# Patient Record
Sex: Male | Born: 1950 | Race: White | Hispanic: No | Marital: Married | Smoking: Never smoker
Health system: Southern US, Academic
[De-identification: ages and names within clinical notes are randomized; demographics above are authoritative.]

## PROBLEM LIST (undated history)

## (undated) ENCOUNTER — Inpatient Hospital Stay: Admission: EM | Discharge status: Absent without leave | Payer: Self-pay | Source: Home / Self Care

## (undated) ENCOUNTER — Inpatient Hospital Stay: Admission: EM | Payer: Self-pay | Source: Home / Self Care

## (undated) ENCOUNTER — Inpatient Hospital Stay (HOSPITAL_COMMUNITY): Payer: Medicare Other | Admitting: PULMONARY DISEASE

## (undated) DIAGNOSIS — E039 Hypothyroidism, unspecified: Secondary | ICD-10-CM

## (undated) DIAGNOSIS — R0602 Shortness of breath: Secondary | ICD-10-CM

## (undated) DIAGNOSIS — G4730 Sleep apnea, unspecified: Secondary | ICD-10-CM

## (undated) DIAGNOSIS — R059 Cough, unspecified: Secondary | ICD-10-CM

## (undated) DIAGNOSIS — E119 Type 2 diabetes mellitus without complications: Secondary | ICD-10-CM

## (undated) DIAGNOSIS — K838 Other specified diseases of biliary tract: Secondary | ICD-10-CM

## (undated) DIAGNOSIS — G8929 Other chronic pain: Secondary | ICD-10-CM

## (undated) DIAGNOSIS — Z9889 Other specified postprocedural states: Secondary | ICD-10-CM

## (undated) DIAGNOSIS — I341 Nonrheumatic mitral (valve) prolapse: Secondary | ICD-10-CM

## (undated) DIAGNOSIS — IMO0002 Reserved for concepts with insufficient information to code with codable children: Secondary | ICD-10-CM

## (undated) DIAGNOSIS — K219 Gastro-esophageal reflux disease without esophagitis: Secondary | ICD-10-CM

## (undated) DIAGNOSIS — M549 Dorsalgia, unspecified: Secondary | ICD-10-CM

## (undated) DIAGNOSIS — R3911 Hesitancy of micturition: Secondary | ICD-10-CM

## (undated) HISTORY — PX: HX BACK SURGERY: SHX140

## (undated) HISTORY — PX: HX APPENDECTOMY: SHX54

## (undated) HISTORY — PX: HX GALL BLADDER SURGERY/CHOLE: SHX55

## (undated) HISTORY — DX: Hypothyroidism, unspecified: E03.9

## (undated) HISTORY — DX: Shortness of breath: R06.02

## (undated) HISTORY — DX: Sleep apnea, unspecified: G47.30

## (undated) HISTORY — DX: Other specified diseases of biliary tract: K83.8

## (undated) HISTORY — PX: CARDIAC CATHETERIZATION: SHX172

## (undated) HISTORY — PX: HX HEART VALVE SURGERY: SHX40

## (undated) HISTORY — PX: HX COLONOSCOPY: 2100001147

## (undated) HISTORY — DX: Type 2 diabetes mellitus without complications: E11.9

## (undated) HISTORY — DX: Cough, unspecified: R05.9

## (undated) HISTORY — DX: Gastro-esophageal reflux disease without esophagitis: K21.9

## (undated) HISTORY — PX: CHOLECYSTECTOMY: SHX55

## (undated) HISTORY — DX: Other chronic pain: G89.29

## (undated) HISTORY — PX: HERNIA REPAIR: SHX51

## (undated) HISTORY — PX: APPENDECTOMY: SHX54

## (undated) HISTORY — PX: OTHER SURGICAL HISTORY: SHX169

## (undated) HISTORY — DX: Hesitancy of micturition: R39.11

## (undated) HISTORY — DX: Nonrheumatic mitral (valve) prolapse: I34.1

## (undated) HISTORY — DX: Reserved for concepts with insufficient information to code with codable children: IMO0002

## (undated) HISTORY — DX: Other specified postprocedural states: Z98.890

## (undated) HISTORY — DX: Dorsalgia, unspecified: M54.9

---

## 2003-05-16 ENCOUNTER — Encounter: Payer: Self-pay | Admitting: Emergency Medicine

## 2003-05-16 ENCOUNTER — Emergency Department (HOSPITAL_COMMUNITY): Admission: EM | Admit: 2003-05-16 | Discharge: 2003-05-16 | Payer: Self-pay | Admitting: Emergency Medicine

## 2004-01-20 ENCOUNTER — Emergency Department (HOSPITAL_COMMUNITY): Admission: EM | Admit: 2004-01-20 | Discharge: 2004-01-20 | Payer: Self-pay | Admitting: Emergency Medicine

## 2004-10-18 ENCOUNTER — Ambulatory Visit: Payer: Self-pay | Admitting: Orthopedic Surgery

## 2005-06-03 ENCOUNTER — Emergency Department (HOSPITAL_COMMUNITY): Admission: EM | Admit: 2005-06-03 | Discharge: 2005-06-03 | Payer: Self-pay | Admitting: Emergency Medicine

## 2007-07-16 ENCOUNTER — Other Ambulatory Visit: Payer: Self-pay

## 2007-07-16 ENCOUNTER — Observation Stay: Payer: Self-pay | Admitting: Internal Medicine

## 2008-03-22 ENCOUNTER — Emergency Department (HOSPITAL_COMMUNITY): Admission: EM | Admit: 2008-03-22 | Discharge: 2008-03-22 | Payer: Self-pay | Admitting: Emergency Medicine

## 2008-11-06 ENCOUNTER — Emergency Department (HOSPITAL_COMMUNITY): Admission: EM | Admit: 2008-11-06 | Discharge: 2008-11-06 | Payer: Self-pay | Admitting: Emergency Medicine

## 2009-01-18 ENCOUNTER — Emergency Department (HOSPITAL_COMMUNITY): Admission: EM | Admit: 2009-01-18 | Discharge: 2009-01-18 | Payer: Self-pay | Admitting: Emergency Medicine

## 2009-01-26 DIAGNOSIS — Z9889 Other specified postprocedural states: Secondary | ICD-10-CM

## 2009-01-26 HISTORY — DX: Other specified postprocedural states: Z98.890

## 2009-02-03 ENCOUNTER — Ambulatory Visit: Payer: Self-pay | Admitting: Internal Medicine

## 2009-02-03 DIAGNOSIS — R109 Unspecified abdominal pain: Secondary | ICD-10-CM | POA: Insufficient documentation

## 2009-02-03 DIAGNOSIS — Z8679 Personal history of other diseases of the circulatory system: Secondary | ICD-10-CM | POA: Insufficient documentation

## 2009-02-03 DIAGNOSIS — R112 Nausea with vomiting, unspecified: Secondary | ICD-10-CM

## 2009-02-03 DIAGNOSIS — R3911 Hesitancy of micturition: Secondary | ICD-10-CM

## 2009-02-03 DIAGNOSIS — M549 Dorsalgia, unspecified: Secondary | ICD-10-CM | POA: Insufficient documentation

## 2009-02-03 DIAGNOSIS — IMO0002 Reserved for concepts with insufficient information to code with codable children: Secondary | ICD-10-CM | POA: Insufficient documentation

## 2009-02-03 DIAGNOSIS — K219 Gastro-esophageal reflux disease without esophagitis: Secondary | ICD-10-CM | POA: Insufficient documentation

## 2009-02-20 ENCOUNTER — Ambulatory Visit: Payer: Self-pay | Admitting: Internal Medicine

## 2009-02-20 ENCOUNTER — Encounter: Payer: Self-pay | Admitting: Internal Medicine

## 2009-02-20 ENCOUNTER — Ambulatory Visit (HOSPITAL_COMMUNITY): Admission: RE | Admit: 2009-02-20 | Discharge: 2009-02-20 | Payer: Self-pay | Admitting: Internal Medicine

## 2009-02-20 HISTORY — PX: COLONOSCOPY: SHX174

## 2009-02-20 HISTORY — PX: ESOPHAGOGASTRODUODENOSCOPY: SHX1529

## 2009-02-21 ENCOUNTER — Ambulatory Visit (HOSPITAL_COMMUNITY): Admission: RE | Admit: 2009-02-21 | Discharge: 2009-02-21 | Payer: Self-pay | Admitting: Internal Medicine

## 2009-02-21 ENCOUNTER — Encounter: Payer: Self-pay | Admitting: Internal Medicine

## 2009-02-23 ENCOUNTER — Encounter: Payer: Self-pay | Admitting: Internal Medicine

## 2009-02-28 ENCOUNTER — Ambulatory Visit (HOSPITAL_COMMUNITY): Admission: RE | Admit: 2009-02-28 | Discharge: 2009-02-28 | Payer: Self-pay | Admitting: Internal Medicine

## 2009-04-05 ENCOUNTER — Ambulatory Visit: Payer: Self-pay | Admitting: Internal Medicine

## 2009-04-05 ENCOUNTER — Encounter: Payer: Self-pay | Admitting: Gastroenterology

## 2009-04-05 DIAGNOSIS — K921 Melena: Secondary | ICD-10-CM

## 2009-04-05 DIAGNOSIS — R109 Unspecified abdominal pain: Secondary | ICD-10-CM | POA: Insufficient documentation

## 2009-04-05 DIAGNOSIS — R197 Diarrhea, unspecified: Secondary | ICD-10-CM | POA: Insufficient documentation

## 2009-04-13 LAB — CONVERTED CEMR LAB
Albumin: 4.5 g/dL (ref 3.5–5.2)
Amylase: 23 units/L (ref 0–105)
Basophils Absolute: 0 10*3/uL (ref 0.0–0.1)
Basophils Relative: 0 % (ref 0–1)
Bilirubin, Direct: 0.1 mg/dL (ref 0.0–0.3)
Hemoglobin: 15.5 g/dL (ref 13.0–17.0)
Lipase: 17 units/L (ref 0–75)
Lymphocytes Relative: 24 % (ref 12–46)
Neutro Abs: 4.9 10*3/uL (ref 1.7–7.7)
Neutrophils Relative %: 67 % (ref 43–77)
Platelets: 201 10*3/uL (ref 150–400)
RDW: 13.8 % (ref 11.5–15.5)
Total Bilirubin: 0.3 mg/dL (ref 0.3–1.2)

## 2009-09-13 ENCOUNTER — Ambulatory Visit: Admission: RE | Admit: 2009-09-13 | Discharge: 2009-09-13 | Payer: Self-pay | Admitting: Neurology

## 2009-09-13 ENCOUNTER — Ambulatory Visit: Payer: Self-pay | Admitting: Vascular Surgery

## 2009-09-13 ENCOUNTER — Encounter (INDEPENDENT_AMBULATORY_CARE_PROVIDER_SITE_OTHER): Payer: Self-pay | Admitting: Neurology

## 2009-12-07 ENCOUNTER — Ambulatory Visit (HOSPITAL_COMMUNITY)
Admission: RE | Admit: 2009-12-07 | Discharge: 2009-12-08 | Payer: Self-pay | Source: Home / Self Care | Admitting: Neurosurgery

## 2010-01-07 ENCOUNTER — Emergency Department (HOSPITAL_COMMUNITY): Admission: EM | Admit: 2010-01-07 | Discharge: 2010-01-07 | Payer: Self-pay | Admitting: Emergency Medicine

## 2010-01-08 ENCOUNTER — Observation Stay (HOSPITAL_COMMUNITY): Admission: EM | Admit: 2010-01-08 | Discharge: 2010-01-10 | Payer: Self-pay | Admitting: Emergency Medicine

## 2010-02-02 ENCOUNTER — Emergency Department (HOSPITAL_COMMUNITY): Admission: EM | Admit: 2010-02-02 | Discharge: 2010-02-02 | Payer: Self-pay | Admitting: Emergency Medicine

## 2010-03-14 ENCOUNTER — Encounter: Admission: RE | Admit: 2010-03-14 | Discharge: 2010-03-14 | Payer: Self-pay | Admitting: Neurosurgery

## 2010-03-28 ENCOUNTER — Encounter: Admission: RE | Admit: 2010-03-28 | Discharge: 2010-03-28 | Payer: Self-pay | Admitting: Neurosurgery

## 2010-03-30 ENCOUNTER — Emergency Department (HOSPITAL_COMMUNITY)
Admission: EM | Admit: 2010-03-30 | Discharge: 2010-03-30 | Payer: Self-pay | Source: Home / Self Care | Admitting: Emergency Medicine

## 2010-04-12 ENCOUNTER — Ambulatory Visit (HOSPITAL_COMMUNITY): Admission: RE | Admit: 2010-04-12 | Discharge: 2010-04-13 | Payer: Self-pay | Admitting: Neurosurgery

## 2010-05-31 ENCOUNTER — Ambulatory Visit (HOSPITAL_COMMUNITY)
Admission: RE | Admit: 2010-05-31 | Discharge: 2010-05-31 | Payer: Self-pay | Source: Home / Self Care | Admitting: Neurosurgery

## 2010-10-05 ENCOUNTER — Emergency Department (HOSPITAL_COMMUNITY)
Admission: EM | Admit: 2010-10-05 | Discharge: 2010-10-05 | Disposition: A | Discharge status: Other Acute Inpatient Hospital | Payer: Self-pay | Source: Home / Self Care | Admitting: Emergency Medicine

## 2010-10-05 ENCOUNTER — Inpatient Hospital Stay (HOSPITAL_COMMUNITY)
Admission: EM | Admit: 2010-10-05 | Discharge: 2010-10-09 | Payer: Self-pay | Attending: Internal Medicine | Admitting: Internal Medicine

## 2010-10-16 ENCOUNTER — Ambulatory Visit: Payer: Self-pay | Admitting: Cardiology

## 2010-10-16 DIAGNOSIS — R079 Chest pain, unspecified: Secondary | ICD-10-CM

## 2010-10-18 ENCOUNTER — Encounter: Payer: Self-pay | Admitting: Adult Health

## 2010-10-23 ENCOUNTER — Ambulatory Visit: Payer: Self-pay | Admitting: Internal Medicine

## 2010-11-29 NOTE — Assessment & Plan Note (Signed)
Summary: POST CATH / CHECK CATH SITE/TG   Visit Type:  Follow-up Referring Provider:  Dr. Al Decant Primary Provider:  Dr.Mead   History of Present Illness: Gary Marsh is a pleasant 60 y/o CM we are seeing post hospitalization for complaints of chest pain, with strong family history of CAD and sudden cardiac death.  He has history of hypertension, dyslipidemia, and arthritis with multiple back surgeries.  He was seen at [facility] and transferred to [facility] where he underwent cardiac catherization.  He was found to have normal coronaries.  He has a follow-up appointment with Dr. Kendell Bane next week for continued GI evaluation for discomfort.  Current Medications (verified): 1)  Atenolol 25 Mg Tabs (Atenolol) .... Once Daily 2)  Lovaza 1 Gm Caps (Omega-3-Acid Ethyl Esters) .... Two Times A Day 3)  Ranitidine Hcl 150 Mg Caps (Ranitidine Hcl) .... Once Daily 4)  Nexium 40 Mg Pack (Esomeprazole Magnesium) .... Once Daily 5)  Lyrica 150 Mg Caps (Pregabalin) .... Two Times A Day 6)  Fiorinal/codeine #3 50-325-40-30 Mg Caps (Butalbital-Asa-Caff-Codeine) .... Qid 7)  Promethazine Hcl 25 Mg Tabs (Promethazine Hcl) .... One By Mouth Every 4-6 Hours As Needed N/v 8)  Hyoscyamine Sulfate 0.125 Mg Subl (Hyoscyamine Sulfate) .... One Sl Qac and At Bedtime or Qid As Needed For Abd Pain, May Cause Urinary Retention  Allergies (verified): No Known Drug Allergies  Comments:  Nurse/Medical Assistant: patient brought med list stated meds are correct patients pharmacy is [facility]  Past History:  Past medical, surgical, family and social histories (including risk factors) reviewed, and no changes noted (except as noted below).  Past Medical History: Reviewed history from 02/03/2009 and no changes required. 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 Left inguinal herniorrhaphy 5 back surgeries Appendectomy (1610'R)  Family History: Reviewed history from 02/03/2009 and no changes required. Father:(70 deceased) MI  Mother: (54 deceased) metastatic colon carcinoma  Siblings: (4 deceased) 1 brother & sister-same kind of cancer behind heart?, 2 deceased with MI 3 living sisters healthy      Social History: Reviewed history from 02/03/2009 and no changes required. Marital Status: Married 1972 Children: 1 daughter healthy Occupation: disabled secondary chronic back pain, 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  Alcohol Use - no Daily Caffeine Use Illicit Drug Use - no Patient gets regular exercise.  Review of Systems       All other systems have been reviewed and are negative unless stated above.   Vital Signs:  Patient profile:   60 year old male Weight:      182 pounds BMI:     26.21 O2 Sat:      97 % on Room air Pulse rate:   93 / minute BP sitting:   124 / 73  (right arm)  Vitals Entered By: Dreama Saa, CNA (October 16, 2010 2:04 PM)  O2 Flow:  Room air  Physical Exam  General:  Well developed, well nourished, in no acute distress. Lungs:  Clear bilaterally to auscultation and percussion. Heart:  Non-displaced PMI, chest non-tender; regular rate and rhythm, S1, S2 without murmurs, rubs or gallops. Carotid upstroke normal, no bruit. Normal abdominal aortic size, no bruits. Femorals normal pulses, no bruits. Pedals normal pulses. No edema, no varicosities. Pulses:  pulses normal in all 4 extremities Psych:  Normal affect.   Impression &  Recommendations:  Problem # 1:  CHEST PAIN UNSPECIFIED (ICD-786.50) As stated, his cardiac catherization is found to be normal .  He is given reassurance and will be seen on a as needed basis.  He is to continue to see Dr. Renard Matter and Dr. Kendell Bane for further assessment and treatment of discomfort.   His updated medication list for this  problem includes:    Atenolol 25 Mg Tabs (Atenolol) ..... Once daily  Patient Instructions: 1)  Your physician recommends that you schedule a follow-up appointment in: as needed  2)  Your physician recommends that you continue on your current medications as directed. Please refer to the Current Medication list given to you today.

## 2010-11-29 NOTE — Assessment & Plan Note (Signed)
Summary: HOSP F/U GU/LAW   Visit Type:  Initial Consult Referring Mialynn Shelvin:  Verne Carrow, MD Primary Care Magda Muise:  Dr. Bethena Midget  CC:  hosp follow up- was having chest pain and cardiac ruled out.  History of Present Illness: Gary Marsh presents today in hospital f/u. He was admitted to [facility] Dec 9-13 secondary to chest pain which was felt to be GI related. He was thoroughly worked up from a cardiac standpoint and all was negative. Reports symptoms since Thanksgiving left upper chest pain/discomfort, intermittent, r/t eating/drinking, states worse if eats too much. described as "sharp". reported as 6/10 when occurs. no belching/gas. Denies nausea. States wife was sick over Thanksgiving and has been stressed out regarding this. States symptoms worsened with stress. Was on Ranitidine 300 mg in the past. Had come off approximately 1 year ago, yet was still taking Nexium once daily.  symptoms occurred near Thanksgiving. was 168 in June 2010, now 184 today. Has been taking Nexium twice daily since last Wednesday. Has noticed improvement of symptoms with this. Last EGD/TCS done in April 2010 with normal esophagus, small antral ulceration s/p biopsy, otherwise normal.     April 2010 by RMR:. Esophagogastroduodenoscopy:  Normal esophagus.  A small antral       ulceration status post biopsy otherwise unremarkable stomach,       normal duodenum one and two.   2. Colonoscopy findings:  Rectal polyp status post hot snare removal,       otherwise unremarkable rectum, scattered left-sided diverticula,       diminutive ascending colon polyp status post cold biopsy removal.       The remainder of the colonic mucosa appeared normal.      Current Medications (verified): 1)  Atenolol 25 Mg Tabs (Atenolol) .... Once Daily 2)  Lovaza 1 Gm Caps (Omega-3-Acid Ethyl Esters) .... Two Times A Day 3)  Ranitidine Hcl 150 Mg Caps (Ranitidine Hcl) .... Once Daily 4)  Nexium 40 Mg Pack (Esomeprazole Magnesium) ....  Once Daily 5)  Lyrica 150 Mg Caps (Pregabalin) .... Two Times A Day 6)  Fiorinal/codeine #3 50-325-40-30 Mg Caps (Butalbital-Asa-Caff-Codeine) .... Qid 7)  Hyoscyamine Sulfate 0.125 Mg Subl (Hyoscyamine Sulfate) .... One Sl Qac and At Bedtime or Qid As Needed For Abd Pain, May Cause Urinary Retention  Allergies (verified): 1)  ! Tegretol (Carbamazepine) 2)  ! 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) GERD (ICD-530.81) Chronic Back pain  Past Surgical History: Last updated: Feb 06, 2009 Left inguinal herniorrhaphy 5 back surgeries Appendectomy (1610'R)  Family History: Last updated: 06-Feb-2009 Father:(70 deceased) MI  Mother: 03/11/1985 deceased) metastatic colon carcinoma  Siblings: (4 deceased) 1 brother & sister-same kind of cancer behind heart?, 2 deceased with MI 3 living sisters healthy      Social History: Last updated: 06-Feb-2009 Marital Status: Married Mar 12, 1971 Children: 1 daughter healthy Occupation: disabled secondary chronic back pain, 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  Alcohol Use - no Daily Caffeine Use Illicit Drug Use - no Patient gets regular exercise.  Review of Systems General:  Denies fever, chills, and anorexia. Eyes:  Denies blurring, irritation, and discharge. ENT:  Denies sore throat, hoarseness, and difficulty swallowing. CV:  Complains of chest pains; denies palpitations, syncope, and dyspnea on exertion. Resp:  Denies dyspnea at rest and wheezing. GI:  Denies difficulty swallowing, pain on swallowing, nausea, change in bowel habits, bloody BM's,  and black BMs. GU:  Denies urinary burning and urinary frequency. MS:  Denies joint pain / LOM, joint stiffness, and joint deformity. Derm:  Denies rash, itching, and dry skin. Neuro:  Denies weakness and syncope. Psych:  Denies depression  and anxiety. Endo:  Denies cold intolerance and heat intolerance. Heme:  Denies bruising and bleeding.  Vital Signs:  Patient profile:   60 year old male Height:      70 inches Weight:      184 pounds BMI:     26.50 Temp:     98.2 degrees F oral Pulse rate:   76 / minute BP sitting:   108 / 72  (left arm) Cuff size:   regular  Vitals Entered By: Gary Marsh (October 23, 2010 10:30 AM)  Physical Exam  General:  Well developed, well nourished, no acute distress. Head:  Normocephalic and atraumatic. Eyes:  sclera without icterus, conjuctiva clear Mouth:  No deformity or lesions, dentition normal. Lungs:  Clear throughout to auscultation. Heart:  Regular rate and rhythm; no murmurs, rubs,  or bruits. Abdomen:  normal bowel sounds, obese, without guarding, without rebound, no distesion, no tenderness, and no hepatomegally or splenomegaly.   Msk:  Symmetrical with no gross deformities. Normal posture. Pulses:  Normal pulses noted. Extremities:  No clubbing, cyanosis, edema or deformities noted. Neurologic:  Alert and  oriented x4;  grossly normal neurologically. Psych:  Alert and cooperative. Normal mood and affect.  Impression & Recommendations:  Problem # 1:  CHEST PAIN UNSPECIFIED (ICD-92.36)  60 year old with hx of reflux, last EGD in April 2010 showing small antral erosion. Recently hospitalized for chest pain, deemed non-cardiac in origin after thorough work-up. Has reported exacerbation of left-sided chest discomfort since Thanksgiving, worsened with stress and overeating. Denies nausea, dysphagia, odynophagia. Has gained 26 lbs since last June. Discomfort significantly improved since Nexium increased to two times a day last week. Likely gastritis, possible ulcer-related, also wt gain has likely exacerbated symptoms.  Continue Nexium two times a day for a total of one month, then daily May need EGD. Will discuss with RMR. Weight loss of 1# per week.  Return in 3  mos  Orders: Consultation Level III [phone])  Appended Document: HOSP F/U GU/LAW 3 MON F/U OPV IS IN THE COMPUTER

## 2011-01-07 LAB — CK TOTAL AND CKMB (NOT AT ARMC)
CK, MB: 1.2 ng/mL (ref 0.3–4.0)
Relative Index: INVALID (ref 0.0–2.5)
Total CK: 40 U/L (ref 7–232)
Total CK: 43 U/L (ref 7–232)

## 2011-01-07 LAB — COMPREHENSIVE METABOLIC PANEL
ALT: 19 U/L (ref 0–53)
AST: 18 U/L (ref 0–37)
CO2: 26 mEq/L (ref 19–32)
Calcium: 9 mg/dL (ref 8.4–10.5)
Creatinine, Ser: 0.94 mg/dL (ref 0.4–1.5)
GFR calc Af Amer: >60 mL/min (ref >60)
GFR calc non Af Amer: >60 mL/min (ref >60)
Glucose, Bld: 104 mg/dL — ABNORMAL HIGH (ref 70–99)
Sodium: 140 mEq/L (ref 135–145)
Total Protein: 7.3 g/dL (ref 6.0–8.3)

## 2011-01-07 LAB — D-DIMER, QUANTITATIVE: D-Dimer, Quant: 0.22 ug/mL-FEU (ref 0.00–0.48)

## 2011-01-07 LAB — POCT CARDIAC MARKERS
CKMB, poc: <1 ng/mL — ABNORMAL LOW (ref 1.0–8.0)
Myoglobin, poc: 33.8 ng/mL (ref 12–200)
Troponin i, poc: <0.05 ng/mL (ref 0.00–0.09)

## 2011-01-07 LAB — DIFFERENTIAL
Eosinophils Absolute: 0.4 10*3/uL (ref 0.0–0.7)
Lymphocytes Relative: 36 % (ref 12–46)
Lymphs Abs: 2.7 10*3/uL (ref 0.7–4.0)
Monocytes Relative: 6 % (ref 3–12)
Neutrophils Relative %: 53 % (ref 43–77)

## 2011-01-07 LAB — TROPONIN I
Troponin I: 0.01 ng/mL (ref 0.00–0.06)
Troponin I: <0.01 ng/mL (ref 0.00–0.06)

## 2011-01-07 LAB — CBC
Hemoglobin: 15.5 g/dL (ref 13.0–17.0)
Platelets: 259 10*3/uL (ref 150–400)
RBC: 4.77 MIL/uL (ref 4.22–5.81)

## 2011-01-08 LAB — BASIC METABOLIC PANEL
BUN: 10 mg/dL (ref 6–23)
BUN: 9 mg/dL (ref 6–23)
BUN: 9 mg/dL (ref 6–23)
CO2: 30 mEq/L (ref 19–32)
Chloride: 101 mEq/L (ref 96–112)
Chloride: 103 mEq/L (ref 96–112)
Chloride: 103 mEq/L (ref 96–112)
Glucose, Bld: 106 mg/dL — ABNORMAL HIGH (ref 70–99)
Glucose, Bld: 118 mg/dL — ABNORMAL HIGH (ref 70–99)
Potassium: 3.4 mEq/L — ABNORMAL LOW (ref 3.5–5.1)
Potassium: 3.9 mEq/L (ref 3.5–5.1)
Potassium: 3.9 mEq/L (ref 3.5–5.1)
Sodium: 136 mEq/L (ref 135–145)
Sodium: 138 mEq/L (ref 135–145)

## 2011-01-08 LAB — CBC
HCT: 38.7 % — ABNORMAL LOW (ref 39.0–52.0)
HCT: 38.7 % — ABNORMAL LOW (ref 39.0–52.0)
HCT: 42.1 % (ref 39.0–52.0)
HCT: 45.1 % (ref 39.0–52.0)
Hemoglobin: 13.5 g/dL (ref 13.0–17.0)
Hemoglobin: 14.6 g/dL (ref 13.0–17.0)
Hemoglobin: 15.8 g/dL (ref 13.0–17.0)
MCH: 31.9 pg (ref 26.0–34.0)
MCHC: 34.7 g/dL (ref 30.0–36.0)
MCV: 91.7 fL (ref 78.0–100.0)
MCV: 92.1 fL (ref 78.0–100.0)
MCV: 92.1 fL (ref 78.0–100.0)
MCV: 92.1 fL (ref 78.0–100.0)
RBC: 4.2 MIL/uL — ABNORMAL LOW (ref 4.22–5.81)
RBC: 4.2 MIL/uL — ABNORMAL LOW (ref 4.22–5.81)
RDW: 12.9 % (ref 11.5–15.5)
RDW: 13 % (ref 11.5–15.5)
RDW: 13.2 % (ref 11.5–15.5)
RDW: 13.4 % (ref 11.5–15.5)
WBC: 11.2 10*3/uL — ABNORMAL HIGH (ref 4.0–10.5)
WBC: 13.4 10*3/uL — ABNORMAL HIGH (ref 4.0–10.5)
WBC: 8.7 10*3/uL (ref 4.0–10.5)

## 2011-01-08 LAB — CARDIAC PANEL(CRET KIN+CKTOT+MB+TROPI)
CK, MB: 0.9 ng/mL (ref 0.3–4.0)
Relative Index: INVALID (ref 0.0–2.5)
Troponin I: 0.01 ng/mL (ref 0.00–0.06)
Troponin I: 0.03 ng/mL (ref 0.00–0.06)

## 2011-01-08 LAB — PROTIME-INR: INR: 1.01 (ref 0.00–1.49)

## 2011-01-08 LAB — LIPID PANEL
LDL Cholesterol: 81 mg/dL (ref 0–99)
VLDL: 30 mg/dL (ref 0–40)

## 2011-01-08 LAB — HEMOGLOBIN A1C: Mean Plasma Glucose: 111 mg/dL (ref <117)

## 2011-01-08 LAB — TSH: TSH: 4.665 u[IU]/mL — ABNORMAL HIGH (ref 0.350–4.500)

## 2011-01-08 LAB — HEPARIN LEVEL (UNFRACTIONATED): Heparin Unfractionated: 0.34 IU/mL (ref 0.30–0.70)

## 2011-01-08 LAB — MRSA PCR SCREENING: MRSA by PCR: NEGATIVE

## 2011-01-13 LAB — COMPREHENSIVE METABOLIC PANEL
BUN: 10 mg/dL (ref 6–23)
CO2: 27 mEq/L (ref 19–32)
Calcium: 9 mg/dL (ref 8.4–10.5)
Creatinine, Ser: 0.85 mg/dL (ref 0.4–1.5)
GFR calc non Af Amer: >60 mL/min (ref >60)
Glucose, Bld: 93 mg/dL (ref 70–99)
Sodium: 136 mEq/L (ref 135–145)
Total Protein: 6.9 g/dL (ref 6.0–8.3)

## 2011-01-13 LAB — CBC
HCT: 44.3 % (ref 39.0–52.0)
Hemoglobin: 15.7 g/dL (ref 13.0–17.0)
MCHC: 35.3 g/dL (ref 30.0–36.0)
MCV: 94.6 fL (ref 78.0–100.0)
RBC: 4.68 MIL/uL (ref 4.22–5.81)
RDW: 13.7 % (ref 11.5–15.5)

## 2011-01-14 LAB — URINALYSIS, ROUTINE W REFLEX MICROSCOPIC
Leukocytes, UA: NEGATIVE
Nitrite: NEGATIVE
Specific Gravity, Urine: 1.015 (ref 1.005–1.030)
Urobilinogen, UA: 0.2 mg/dL (ref 0.0–1.0)

## 2011-01-14 LAB — DIFFERENTIAL
Basophils Relative: 0 % (ref 0–1)
Monocytes Relative: 6 % (ref 3–12)
Neutro Abs: 7.9 10*3/uL — ABNORMAL HIGH (ref 1.7–7.7)
Neutrophils Relative %: 71 % (ref 43–77)

## 2011-01-14 LAB — COMPREHENSIVE METABOLIC PANEL
Alkaline Phosphatase: 78 U/L (ref 39–117)
BUN: 18 mg/dL (ref 6–23)
Calcium: 10.1 mg/dL (ref 8.4–10.5)
Glucose, Bld: 118 mg/dL — ABNORMAL HIGH (ref 70–99)
Potassium: 4.2 mEq/L (ref 3.5–5.1)
Total Protein: 8.6 g/dL — ABNORMAL HIGH (ref 6.0–8.3)

## 2011-01-14 LAB — CBC
HCT: 51 % (ref 39.0–52.0)
Hemoglobin: 17.6 g/dL — ABNORMAL HIGH (ref 13.0–17.0)
MCHC: 34.6 g/dL (ref 30.0–36.0)
RDW: 13.3 % (ref 11.5–15.5)

## 2011-01-14 LAB — URINE MICROSCOPIC-ADD ON

## 2011-01-15 ENCOUNTER — Encounter (INDEPENDENT_AMBULATORY_CARE_PROVIDER_SITE_OTHER): Payer: Self-pay | Admitting: *Deleted

## 2011-01-17 LAB — BASIC METABOLIC PANEL
BUN: 19 mg/dL (ref 6–23)
CO2: 22 mEq/L (ref 19–32)
Calcium: 9.8 mg/dL (ref 8.4–10.5)
Creatinine, Ser: 1.26 mg/dL (ref 0.4–1.5)
Glucose, Bld: 87 mg/dL (ref 70–99)

## 2011-01-17 LAB — DIFFERENTIAL
Basophils Absolute: 0 10*3/uL (ref 0.0–0.1)
Basophils Relative: 0 % (ref 0–1)
Eosinophils Absolute: 0.2 10*3/uL (ref 0.0–0.7)
Neutro Abs: 8.5 10*3/uL — ABNORMAL HIGH (ref 1.7–7.7)
Neutrophils Relative %: 73 % (ref 43–77)

## 2011-01-17 LAB — CBC
MCHC: 35.6 g/dL (ref 30.0–36.0)
MCV: 95.1 fL (ref 78.0–100.0)
RDW: 13.1 % (ref 11.5–15.5)

## 2011-01-20 LAB — DIFFERENTIAL
Basophils Absolute: 0 10*3/uL (ref 0.0–0.1)
Basophils Relative: 0 % (ref 0–1)
Basophils Relative: 0 % (ref 0–1)
Eosinophils Absolute: 0.2 10*3/uL (ref 0.0–0.7)
Eosinophils Relative: 3 % (ref 0–5)
Lymphs Abs: 1.3 10*3/uL (ref 0.7–4.0)
Monocytes Absolute: 0.7 10*3/uL (ref 0.1–1.0)
Monocytes Relative: 6 % (ref 3–12)
Neutro Abs: 4.9 10*3/uL (ref 1.7–7.7)
Neutrophils Relative %: 56 % (ref 43–77)
Neutrophils Relative %: 76 % (ref 43–77)

## 2011-01-20 LAB — URINALYSIS, ROUTINE W REFLEX MICROSCOPIC
Bilirubin Urine: NEGATIVE
Leukocytes, UA: NEGATIVE
Nitrite: NEGATIVE
Specific Gravity, Urine: 1.025 (ref 1.005–1.030)
Urobilinogen, UA: 0.2 mg/dL (ref 0.0–1.0)

## 2011-01-20 LAB — CBC
HCT: 42.9 % (ref 39.0–52.0)
HCT: 48 % (ref 39.0–52.0)
Hemoglobin: 15.1 g/dL (ref 13.0–17.0)
MCHC: 35 g/dL (ref 30.0–36.0)
MCV: 93.7 fL (ref 78.0–100.0)
MCV: 93.9 fL (ref 78.0–100.0)
RBC: 5.11 MIL/uL (ref 4.22–5.81)
WBC: 8.9 10*3/uL (ref 4.0–10.5)
WBC: 8.9 10*3/uL (ref 4.0–10.5)

## 2011-01-20 LAB — COMPREHENSIVE METABOLIC PANEL
Alkaline Phosphatase: 60 U/L (ref 39–117)
BUN: 5 mg/dL — ABNORMAL LOW (ref 6–23)
CO2: 27 mEq/L (ref 19–32)
Chloride: 105 mEq/L (ref 96–112)
Creatinine, Ser: 1.01 mg/dL (ref 0.4–1.5)
GFR calc non Af Amer: >60 mL/min (ref >60)
Glucose, Bld: 91 mg/dL (ref 70–99)
Potassium: 3.4 mEq/L — ABNORMAL LOW (ref 3.5–5.1)
Total Bilirubin: 0.8 mg/dL (ref 0.3–1.2)

## 2011-01-20 LAB — C-REACTIVE PROTEIN: CRP: 0.1 mg/dL — ABNORMAL LOW (ref <0.6)

## 2011-01-20 LAB — HEPATIC FUNCTION PANEL
AST: 18 U/L (ref 0–37)
Albumin: 3.6 g/dL (ref 3.5–5.2)
Total Protein: 6.4 g/dL (ref 6.0–8.3)

## 2011-01-20 LAB — BASIC METABOLIC PANEL
BUN: 9 mg/dL (ref 6–23)
CO2: 24 mEq/L (ref 19–32)
Chloride: 100 mEq/L (ref 96–112)
Creatinine, Ser: 0.94 mg/dL (ref 0.4–1.5)
GFR calc Af Amer: >60 mL/min (ref >60)
Potassium: 4.3 mEq/L (ref 3.5–5.1)

## 2011-01-20 LAB — WOUND CULTURE

## 2011-01-20 LAB — LIPASE, BLOOD: Lipase: 28 U/L (ref 11–59)

## 2011-01-24 NOTE — Letter (Signed)
Summary: Recall Office Visit  [facility]  [address]   [place], [place] [address]   Phone: [phone]  Fax: [phone]      January 15, 2011   WINDELL MUSSON [address] [place], [place]  [address] 11-11-1950   Dear Mr. Herschberger,   According to our records, it is time for you to schedule a follow-up office visit with [place].   At your convenience, please call [phone] to schedule an office visit. If you have any questions, concerns, or feel that this letter is in error, we would appreciate your call.   Sincerely,    Diana Eves  [facility] Ph: [phone]   Fax: [phone]

## 2011-02-06 LAB — COMPREHENSIVE METABOLIC PANEL
AST: 15 U/L (ref 0–37)
CO2: 28 mEq/L (ref 19–32)
Calcium: 8.6 mg/dL (ref 8.4–10.5)
Creatinine, Ser: 1.01 mg/dL (ref 0.4–1.5)
GFR calc Af Amer: >60 mL/min (ref >60)
GFR calc non Af Amer: >60 mL/min (ref >60)

## 2011-02-06 LAB — LIPASE, BLOOD: Lipase: 23 U/L (ref 11–59)

## 2011-02-07 LAB — BASIC METABOLIC PANEL
CO2: 25 mEq/L (ref 19–32)
Chloride: 105 mEq/L (ref 96–112)
Glucose, Bld: 108 mg/dL — ABNORMAL HIGH (ref 70–99)
Potassium: 3.7 mEq/L (ref 3.5–5.1)
Sodium: 138 mEq/L (ref 135–145)

## 2011-02-07 LAB — CBC
Hemoglobin: 15.6 g/dL (ref 13.0–17.0)
RBC: 4.77 MIL/uL (ref 4.22–5.81)

## 2011-02-07 LAB — DIFFERENTIAL
Lymphocytes Relative: 24 % (ref 12–46)
Monocytes Absolute: 0.4 10*3/uL (ref 0.1–1.0)
Monocytes Relative: 6 % (ref 3–12)
Neutro Abs: 4.3 10*3/uL (ref 1.7–7.7)

## 2011-02-11 LAB — BASIC METABOLIC PANEL
BUN: 9 mg/dL (ref 6–23)
Chloride: 103 mEq/L (ref 96–112)
Glucose, Bld: 169 mg/dL — ABNORMAL HIGH (ref 70–99)
Potassium: 3 mEq/L — ABNORMAL LOW (ref 3.5–5.1)

## 2011-02-11 LAB — CBC
HCT: 44.7 % (ref 39.0–52.0)
MCV: 93.6 fL (ref 78.0–100.0)
Platelets: 205 10*3/uL (ref 150–400)
RDW: 13 % (ref 11.5–15.5)
WBC: 9 10*3/uL (ref 4.0–10.5)

## 2011-02-11 LAB — DIFFERENTIAL
Eosinophils Absolute: 0.5 10*3/uL (ref 0.0–0.7)
Eosinophils Relative: 5 % (ref 0–5)
Lymphs Abs: 1.8 10*3/uL (ref 0.7–4.0)

## 2011-02-11 LAB — POCT CARDIAC MARKERS

## 2011-02-28 ENCOUNTER — Encounter: Payer: Self-pay | Admitting: Gastroenterology

## 2011-02-28 ENCOUNTER — Ambulatory Visit (INDEPENDENT_AMBULATORY_CARE_PROVIDER_SITE_OTHER): Payer: Self-pay | Admitting: Gastroenterology

## 2011-02-28 VITALS — BP 123/69 | HR 91 | Temp 98.3°F | Ht 70.0 in | Wt 188.6 lb

## 2011-02-28 DIAGNOSIS — R109 Unspecified abdominal pain: Secondary | ICD-10-CM

## 2011-02-28 DIAGNOSIS — R079 Chest pain, unspecified: Secondary | ICD-10-CM

## 2011-02-28 DIAGNOSIS — K219 Gastro-esophageal reflux disease without esophagitis: Secondary | ICD-10-CM

## 2011-02-28 DIAGNOSIS — R1032 Left lower quadrant pain: Secondary | ICD-10-CM

## 2011-02-28 DIAGNOSIS — K649 Unspecified hemorrhoids: Secondary | ICD-10-CM

## 2011-02-28 LAB — CBC WITH DIFFERENTIAL/PLATELET
Basophils Absolute: 0 10*3/uL (ref 0.0–0.1)
Eosinophils Relative: 4 % (ref 0–5)
HCT: 41.3 % (ref 39.0–52.0)
Hemoglobin: 14.5 g/dL (ref 13.0–17.0)
Lymphocytes Relative: 22 % (ref 12–46)
Lymphs Abs: 2.5 10*3/uL (ref 0.7–4.0)
MCV: 91.6 fL (ref 78.0–100.0)
Monocytes Absolute: 1 10*3/uL (ref 0.1–1.0)
Monocytes Relative: 9 % (ref 3–12)
RDW: 13.8 % (ref 11.5–15.5)
WBC: 11.4 10*3/uL — ABNORMAL HIGH (ref 4.0–10.5)

## 2011-02-28 MED ORDER — HYDROCORTISONE ACETATE 25 MG RE SUPP: 25.0000 mg | Freq: Two times a day (BID) | RECTAL | Status: AC

## 2011-02-28 NOTE — Patient Instructions (Addendum)
Please have labs drawn.  We have set you up for a CT scan as well. We will call you with the results.  Continue the Nexium daily.  Try to follow a low-fat diet. Let's shoot for a 1 # wt loss per week. See handout, which can help you with meal planning as well.   We will see you back in 3 months.

## 2011-02-28 NOTE — Progress Notes (Signed)
Referring Provider: Dr. Clifton James Primary Care Physician:  Dr. Bethena Midget Primary Gastroenterologist: Dr. Jena Gauss  Chief Complaint  Patient presents with  . Follow-up    HPI:   Mr. Gary Marsh presents in f/u for left-sided chest discomfort, reflux, cardiac work-up negative. Most recent EGD in 2010. He reports resolution of this discomfort; had taken Nexium BID for one month then return to 1 per day. Denies reflux exacerbation or dysphagia. Up an additional 4 lbs. Has gained close to 30 lbs since last June. States cooks for his wife, who has COPD, which makes it difficult for him to really watch his portion sizes as he eats her leftovers.  Now complains of LLQ pain, somewhat underlying. Worsened with sitting or driving for a long time. Described as sharp, intermittent. Not affected by eating/drinking. BM twice/day. No melena or brbpr. No fever or chills. At times, rectal discomfort with BM.  Reports hx of shingles, states affected left-sided abdomen  Past Medical History  Diagnosis Date  . MVP (mitral valve prolapse)   . DDD (degenerative disc disease)   . GERD (gastroesophageal reflux disease)   . Chronic back pain   . S/P endoscopy April 2010    small antral erosion  . S/P colonoscopy April 2010    left-sided diverticula, small rectal polyp    Past Surgical History  Procedure Date  . Left inguinal hernia repair   . Back surgery x 5   . Appendectomy     Current Outpatient Prescriptions  Medication Sig Dispense Refill  . atenolol (TENORMIN) 25 MG tablet Take 25 mg by mouth daily.        . butalbital-aspirin-caffeine (FIORINAL) 50-325-40 MG per capsule Take 1 capsule by mouth 2 (two) times daily as needed.        [name] esomeprazole (NEXIUM) 40 MG capsule Take 40 mg by mouth daily before breakfast.        . omega-3 acid ethyl esters (LOVAZA) 1 G capsule Take 2 g by mouth 2 (two) times daily.        . pregabalin (LYRICA) 150 MG capsule Take 150 mg by mouth 2 (two) times daily.        .  ranitidine (ZANTAC) 300 MG capsule Take 300 mg by mouth every evening.                Allergies as of 02/28/2011 - Review Complete 02/28/2011  Allergen Reaction Noted  . Carbamazepine    . Duloxetine      Family History  Problem Relation Age of Onset  . Colon cancer Mother     deceased age 51    History   Social History  . Marital Status: Married    Spouse Name: N/A    Number of Children: N/A  . Years of Education: N/A   Social History Main Topics  . Smoking status: Never Smoker   . Smokeless tobacco: None  . Alcohol Use: No  . Drug Use: No    Review of Systems: Gen: Denies fever, chills, anorexia. Denies fatigue, weakness, weight loss.  CV: Denies chest pain, palpitations, syncope, peripheral edema, and claudication. Resp: Denies dyspnea at rest, cough, wheezing, coughing up blood, and pleurisy. GI: Denies vomiting blood, jaundice, and fecal incontinence.   Denies dysphagia or odynophagia. Derm: Denies rash, itching, dry skin Psych: Denies depression, anxiety, memory loss, confusion. No homicidal or suicidal ideation.  Heme: Denies bruising, bleeding, and enlarged lymph nodes.  Physical Exam: BP 123/69  Pulse 91  Temp(Src) 98.3 F (36.8 C) (  Tympanic)  Ht 5\' 10"  (1.778 m)  Wt 188 lb 9.6 oz (85.548 kg)  BMI 27.06 kg/m2 General:   Alert and oriented. No distress noted. Pleasant and cooperative.  Head:  Normocephalic and atraumatic. Eyes:  Conjuctiva clear without scleral icterus. Mouth:  Oral mucosa pink and moist. Good dentition. No lesions. Neck:  Supple, without mass or thyromegaly. Heart:  S1, S2 present without murmurs, rubs, or gallops. Regular rate and rhythm. Abdomen:  +BS, soft, non-distended. Mild tenderness to palpation LLQ. No rebound or guarding. No HSM or masses noted. Rectal: small hemorrhoidal tag, no thrombosed hemorrhoids. No discharge noted. No other abnormalities.  Msk:  Symmetrical without gross deformities. Normal posture. Extremities:   Without edema. Neurologic:  Alert and  oriented x4;  grossly normal neurologically. Skin:  Intact without significant lesions or rashes. Psych:  Alert and cooperative. Normal mood and affect.

## 2011-03-03 ENCOUNTER — Encounter: Payer: Self-pay | Admitting: Gastroenterology

## 2011-03-03 DIAGNOSIS — R1032 Left lower quadrant pain: Secondary | ICD-10-CM | POA: Insufficient documentation

## 2011-03-03 DIAGNOSIS — K649 Unspecified hemorrhoids: Secondary | ICD-10-CM | POA: Insufficient documentation

## 2011-03-03 NOTE — Assessment & Plan Note (Signed)
Resolved since last visit. Although gained 4 lbs, responded well to Nexium BID and is now back to once/day. Likely GERD related; pt instructed on importance of wt loss, lifestyle changes. Continue Nexium daily. Attempt wt loss, follow low-fat diet. Last EGD April 2010. No dysphagia/odynophagia.

## 2011-03-03 NOTE — Assessment & Plan Note (Signed)
Occasional rectal discomfort with BM although no melena or brbpr. Does have hemorrhoidal tag on rectal exam. Question component of ext/internal hemorrhoids. Will trial anusol suppositories X 10 days. High fiber diet, avoid constipation.

## 2011-03-03 NOTE — Assessment & Plan Note (Addendum)
Several month hx of worsening LLQ abdominal pain, described as underlying but intermittent waves of "sharp" discomfort. Worsened with sitting/driving for long periods of time. No change in bowel habits, no melena or brbpr. BM twice/day. Question functional abdominal pain, possible residual effect from shingles though less likely. Does have known diverticula; will proceed with CT to assess for any etiology with further rec's to follow. CBC today.

## 2011-03-04 ENCOUNTER — Ambulatory Visit (HOSPITAL_COMMUNITY)
Admission: RE | Admit: 2011-03-04 | Discharge: 2011-03-04 | Disposition: A | Discharge status: Home / Self Care | Payer: Medicare Other | Source: Ambulatory Visit | Attending: Gastroenterology | Admitting: Gastroenterology

## 2011-03-04 DIAGNOSIS — R1032 Left lower quadrant pain: Secondary | ICD-10-CM

## 2011-03-04 MED ORDER — IOHEXOL 300 MG/ML  SOLN
100.0000 mL | Freq: Once | INTRAMUSCULAR | Status: AC | PRN
Start: 2011-03-04 — End: 2011-03-04
  Administered 2011-03-04: 100 mL via INTRAVENOUS

## 2011-03-04 NOTE — Progress Notes (Signed)
Cc to PCP 

## 2011-03-10 ENCOUNTER — Emergency Department (HOSPITAL_COMMUNITY)
Admission: EM | Admit: 2011-03-10 | Discharge: 2011-03-10 | Disposition: A | Discharge status: Home / Self Care | Payer: Medicare Other | Attending: Emergency Medicine | Admitting: Emergency Medicine

## 2011-03-10 ENCOUNTER — Emergency Department (HOSPITAL_COMMUNITY): Payer: Medicare Other

## 2011-03-10 DIAGNOSIS — Z79899 Other long term (current) drug therapy: Secondary | ICD-10-CM | POA: Insufficient documentation

## 2011-03-10 DIAGNOSIS — M545 Low back pain, unspecified: Secondary | ICD-10-CM | POA: Insufficient documentation

## 2011-03-10 DIAGNOSIS — W3189XA Contact with other specified machinery, initial encounter: Secondary | ICD-10-CM | POA: Insufficient documentation

## 2011-03-10 DIAGNOSIS — Y92009 Unspecified place in unspecified non-institutional (private) residence as the place of occurrence of the external cause: Secondary | ICD-10-CM | POA: Insufficient documentation

## 2011-03-10 DIAGNOSIS — M79609 Pain in unspecified limb: Secondary | ICD-10-CM | POA: Insufficient documentation

## 2011-03-10 DIAGNOSIS — E7800 Pure hypercholesterolemia, unspecified: Secondary | ICD-10-CM | POA: Insufficient documentation

## 2011-03-12 NOTE — Op Note (Signed)
Gary Marsh, Gary Marsh             ACCOUNT NO.:  [id number]   MEDICAL RECORD NO.:  [id number]          PATIENT TYPE:  AMB   LOCATION:  DAY                           FACILITY:  [facility]   PHYSICIAN:  R. Roetta Sessions, M.D. DATE OF BIRTH:  05/24/1951   DATE OF PROCEDURE:  02/20/2009  DATE OF DISCHARGE:                               OPERATIVE REPORT   Esophagogastroduodenoscopy with gastric biopsy, followed by colonoscopy,  biopsy snare polypectomy.   INDICATIONS FOR PROCEDURE:  A 60 year old gentleman with intermittent  nausea, vomiting, left-sided abdominal pain, alternating constipation,  diarrhea.  He has not had any fever, chills or weight loss, has not had  a CT as of yet.  Abdominal films, CBC normal back in March.  EGD and  colonoscopy now being done.  The risks, benefits, alternatives and  limitations have been discussed, questions answered.  Please see  documentation in the medical record.   PROCEDURE NOTE:  O2 saturation, blood pressure, pulse and respirations  monitored throughout the entirety of both procedures.  Conscious  sedation with Versed 6 mg IV and Demerol 125 mg IV in divided doses.  Cetacaine spray for topical pharyngeal anesthesia.  Instrumentation:  Pentax video chip system.   EGD FINDINGS:  Examination of the tubular esophagus revealed no mucosal  abnormalities.  The EG junction was easily traversed.   Stomach:  The gastric cavity was emptied and insufflated well with air.  Thorough examination of the gastric mucosa including retroflexion of the  proximal stomach and esophagogastric junction demonstrated multiple 1-2  mm antral ulcerations.  This did not appear to be representative of  infiltrating process.  There was surrounding satellite erosions as well.  The remaining gastric mucosa appeared unremarkable.  The pylorus was  patent and easily traversed.  Examination of the bulb and second portion  revealed no abnormalities.  Therapeutic/diagnostic maneuver  was  performed.  These areas of  antral ulcers were biopsied for histologic  study.  The patient tolerated the procedure well and was prepared for  colonoscopy.  Digital rectal exam revealed no abnormalities.  The prep  was adequate.  Colon:  The colonic mucosa was surveyed from the  rectosigmoid junction through the left transverse to the right colon to  the appendiceal orifice, ileocecal valve and cecum.  These structures  were well seen and photographed for the record.  From this level, the  scope was slowly and cautiously withdrawn.  All previous mentioned  mucosal surfaces were again seen.  The patient had a diminutive polyp  just distal to the ileocecal valve and ascending segment which was cold  biopsied/removed.  The patient had occasional left-sided diverticula.  The remainder of the colonic mucosa appeared entirely normal.  The scope  was pulled down to the rectum where thorough examination of the  rectal  mucosa including retroflexion of the anal verge demonstrated a 8 mm  sessile polyp at 7 cm from the anal verge which was hot snare removed,  otherwise the rectal mucosa appeared normal.  The patient tolerated both  procedures and was reactive after endoscopy.  Cecal withdrawal time 11  minutes.  IMPRESSION:  1. Esophagogastroduodenoscopy:  Normal esophagus.  A small antral      ulceration status post biopsy otherwise unremarkable stomach,      normal duodenum one and two.  2. Colonoscopy findings:  Rectal polyp status post hot snare removal,      otherwise unremarkable rectum, scattered left-sided diverticula,      diminutive ascending colon polyp status post cold biopsy removal.      The remainder of the colonic mucosa appeared normal.   RECOMMENDATIONS:  1. We will follow up on path.  2. Diverticulosis and polyp literature provided to Gary Marsh.  3. He is to continue Nexium 40 mg orally daily.   Today's findings would not explain his alteration in bowel function or   his abdominal pain.  We will do additional laboratory studies today in a  way of a Chem-20 and a serum lipase and we will pursue an abdominal  pelvic CT with IV and oral contrast.  Further recommendations to follow.      Gary Marsh, M.D.  Electronically Signed     RMR/MEDQ  D:  02/20/2009  T:  02/20/2009  Job:  [id number]

## 2011-07-24 LAB — BASIC METABOLIC PANEL
Calcium: 8.5
GFR calc Af Amer: >60
GFR calc non Af Amer: >60
Glucose, Bld: 108 — ABNORMAL HIGH
Potassium: 3.9
Sodium: 131 — ABNORMAL LOW

## 2011-07-24 LAB — CBC
HCT: 44.5
Hemoglobin: 16
RDW: 12.4
WBC: 10.7 — ABNORMAL HIGH

## 2011-07-24 LAB — URINE MICROSCOPIC-ADD ON

## 2011-07-24 LAB — URINALYSIS, ROUTINE W REFLEX MICROSCOPIC
Glucose, UA: NEGATIVE
Leukocytes, UA: NEGATIVE
Specific Gravity, Urine: 1.02
pH: 7

## 2012-10-17 ENCOUNTER — Encounter (HOSPITAL_COMMUNITY): Payer: Self-pay | Admitting: *Deleted

## 2012-10-17 ENCOUNTER — Emergency Department (HOSPITAL_COMMUNITY): Payer: Medicare Other

## 2012-10-17 ENCOUNTER — Emergency Department (HOSPITAL_COMMUNITY)
Admission: EM | Admit: 2012-10-17 | Discharge: 2012-10-17 | Disposition: A | Discharge status: Home / Self Care | Payer: Medicare Other | Attending: Emergency Medicine | Admitting: Emergency Medicine

## 2012-10-17 DIAGNOSIS — Z79899 Other long term (current) drug therapy: Secondary | ICD-10-CM | POA: Insufficient documentation

## 2012-10-17 DIAGNOSIS — IMO0002 Reserved for concepts with insufficient information to code with codable children: Secondary | ICD-10-CM | POA: Insufficient documentation

## 2012-10-17 DIAGNOSIS — Z87448 Personal history of other diseases of urinary system: Secondary | ICD-10-CM | POA: Insufficient documentation

## 2012-10-17 DIAGNOSIS — M25569 Pain in unspecified knee: Secondary | ICD-10-CM | POA: Insufficient documentation

## 2012-10-17 DIAGNOSIS — G8929 Other chronic pain: Secondary | ICD-10-CM | POA: Insufficient documentation

## 2012-10-17 DIAGNOSIS — K219 Gastro-esophageal reflux disease without esophagitis: Secondary | ICD-10-CM | POA: Insufficient documentation

## 2012-10-17 DIAGNOSIS — Z8719 Personal history of other diseases of the digestive system: Secondary | ICD-10-CM | POA: Insufficient documentation

## 2012-10-17 DIAGNOSIS — I059 Rheumatic mitral valve disease, unspecified: Secondary | ICD-10-CM | POA: Insufficient documentation

## 2012-10-17 LAB — CBC WITH DIFFERENTIAL/PLATELET
HCT: 44.5 % (ref 39.0–52.0)
Hemoglobin: 15.8 g/dL (ref 13.0–17.0)
Lymphocytes Relative: 33 % (ref 12–46)
Lymphs Abs: 2.5 10*3/uL (ref 0.7–4.0)
Monocytes Absolute: 0.6 10*3/uL (ref 0.1–1.0)
Monocytes Relative: 7 % (ref 3–12)
Neutro Abs: 4.2 10*3/uL (ref 1.7–7.7)
Neutrophils Relative %: 56 % (ref 43–77)
RBC: 4.82 MIL/uL (ref 4.22–5.81)

## 2012-10-17 LAB — SYNOVIAL CELL COUNT + DIFF, W/ CRYSTALS
Crystals, Fluid: NEGATIVE
Lymphocytes-Synovial Fld: 1 % (ref 0–20)
WBC, Synovial: 291 /mm3 — ABNORMAL HIGH (ref 0–200)

## 2012-10-17 LAB — COMPREHENSIVE METABOLIC PANEL
Albumin: 4 g/dL (ref 3.5–5.2)
Alkaline Phosphatase: 90 U/L (ref 39–117)
BUN: 11 mg/dL (ref 6–23)
CO2: 30 mEq/L (ref 19–32)
Chloride: 99 mEq/L (ref 96–112)
Creatinine, Ser: 0.82 mg/dL (ref 0.50–1.35)
GFR calc non Af Amer: >90 mL/min (ref >90)
Glucose, Bld: 88 mg/dL (ref 70–99)
Potassium: 4 mEq/L (ref 3.5–5.1)
Total Bilirubin: 0.2 mg/dL — ABNORMAL LOW (ref 0.3–1.2)

## 2012-10-17 LAB — URIC ACID: Uric Acid, Serum: 9.6 mg/dL — ABNORMAL HIGH (ref 4.0–7.8)

## 2012-10-17 MED ORDER — COLCHICINE 0.6 MG PO TABS
0.6000 mg | ORAL_TABLET | Freq: Once | ORAL | Status: AC
  Administered 2012-10-17: 0.6 mg via ORAL
  Filled 2012-10-17: qty 1

## 2012-10-17 MED ORDER — INDOMETHACIN 25 MG PO CAPS
50.0000 mg | ORAL_CAPSULE | Freq: Once | ORAL | Status: AC
  Administered 2012-10-17: 50 mg via ORAL
  Filled 2012-10-17: qty 2

## 2012-10-17 MED ORDER — LIDOCAINE HCL (PF) 1 % IJ SOLN
5.0000 mL | Freq: Once | INTRAMUSCULAR | Status: AC
  Administered 2012-10-17: 5 mL
  Filled 2012-10-17: qty 5

## 2012-10-17 MED ORDER — OXYCODONE-ACETAMINOPHEN 5-325 MG PO TABS
2.0000 | ORAL_TABLET | Freq: Once | ORAL | Status: AC
  Administered 2012-10-17: 2 via ORAL
  Filled 2012-10-17: qty 2

## 2012-10-17 MED ORDER — OXYCODONE-ACETAMINOPHEN 5-325 MG PO TABS: 2.0000 | ORAL_TABLET | ORAL | Status: DC | PRN

## 2012-10-17 MED ORDER — IBUPROFEN 800 MG PO TABS: 800.0000 mg | ORAL_TABLET | Freq: Three times a day (TID) | ORAL | Status: DC

## 2012-10-17 MED ORDER — SODIUM CHLORIDE 0.9 % IV BOLUS (SEPSIS)
1000.0000 mL | Freq: Once | INTRAVENOUS | Status: AC
Start: 2012-10-17 — End: 2012-10-17
  Administered 2012-10-17: 1000 mL via INTRAVENOUS

## 2012-10-17 MED ORDER — HYDROMORPHONE HCL PF 1 MG/ML IJ SOLN
1.0000 mg | Freq: Once | INTRAMUSCULAR | Status: AC
  Administered 2012-10-17: 1 mg via INTRAVENOUS
  Filled 2012-10-17: qty 1

## 2012-10-17 NOTE — ED Notes (Signed)
REQUESTS PAIN MED to go since drugstores are closed now

## 2012-10-17 NOTE — ED Provider Notes (Signed)
History   This chart was scribed for Gary Octave, MD by [name], ED Scribe. This patient was seen in room APA09/APA09 and the patient's care was started at 1502.   CSN: [id number]  Arrival date & time 10/17/12  1454   First MD Initiated Contact with Patient 10/17/12 1502      Chief Complaint  Patient presents with  . Knee Pain     The history is provided by the patient. No language interpreter was used.    Gary Marsh is a 61 y.o. male who presents to the Emergency Department complaining of constant, ongoing, severe right knee pain starting 2 weeks ago. Pt denies any injury to the area. Pt states he had an MRI done in [place] and he was treated for gout there. Pt states he has some chest pain but denies any fever, other joint pain, vomiting. He denies h/o heart or lung problems.   Pt has h/o chronic back pain, DDD, MVP. Pt denies smoking and alcohol use. Past Medical History  Diagnosis Date  . MVP (mitral valve prolapse)   . DDD (degenerative disc disease)   . GERD (gastroesophageal reflux disease)   . Chronic back pain   . S/P endoscopy April 2010    small antral erosion  . S/P colonoscopy April 2010    left-sided diverticula, small rectal polyp  . Urinary hesitancy     Hx    Past Surgical History  Procedure Date  . Left inguinal hernia repair   . Back surgery x 5   . Appendectomy     Family History  Problem Relation Age of Onset  . Colon cancer Mother     deceased age 54    History  Substance Use Topics  . Smoking status: Never Smoker   . Smokeless tobacco: Not on file  . Alcohol Use: No      Review of Systems  A complete 10 system review of systems was obtained and all systems are negative except as noted in the HPI and PMH.    Allergies  Carbamazepine and Duloxetine  Home Medications   Current Outpatient Rx  Name  Route  Sig  Dispense  Refill  . ATENOLOL 25 MG PO TABS   Oral   Take 25 mg by mouth daily.           [name]  ESOMEPRAZOLE MAGNESIUM 40 MG PO CPDR   Oral   Take 40 mg by mouth daily before breakfast.           . OMEGA-3-ACID ETHYL ESTERS 1 G PO CAPS   Oral   Take 2 g by mouth 2 (two) times daily.           [name] PREGABALIN 150 MG PO CAPS   Oral   Take 150 mg by mouth 2 (two) times daily.           [name] RANITIDINE HCL 300 MG PO CAPS   Oral   Take 300 mg by mouth every evening.           . IBUPROFEN 800 MG PO TABS   Oral   Take 1 tablet (800 mg total) by mouth 3 (three) times daily.   21 tablet   0   . OXYCODONE-ACETAMINOPHEN 5-325 MG PO TABS   Oral   Take 2 tablets by mouth every 4 (four) hours as needed for pain.   15 tablet   0     BP 131/80  Pulse 53  Temp  97.8 F (36.6 C) (Oral)  Resp 16  Ht 5\' 10"  (1.778 m)  Wt 207 lb (93.895 kg)  BMI 29.70 kg/m2  SpO2 97%  Physical Exam  Nursing note and vitals reviewed. Constitutional: He appears well-developed and well-nourished.  HENT:  Head: Normocephalic and atraumatic.  Eyes: Conjunctivae normal are normal. Pupils are equal, round, and reactive to light.  Neck: Neck supple. No tracheal deviation present. No thyromegaly present.  Cardiovascular: Normal rate and regular rhythm.   No murmur heard.       2+ DP and PT pulse.    Pulmonary/Chest: Effort normal and breath sounds normal.  Abdominal: Soft. Bowel sounds are normal. He exhibits no distension. There is no tenderness.  Musculoskeletal: Normal range of motion. He exhibits no edema and no tenderness.       Tender over medial joint line, diffuse knee effusion, no ligament instability, reduced ROM secondary to pain.   Neurological: He is alert. Coordination normal.  Skin: Skin is warm and dry. No rash noted.       No cellulitis or warmth.  Psychiatric: He has a normal mood and affect.    ED Course  ARTHOCENTESIS Date/Time: 10/17/2012 4:52 PM Performed by: Gary Marsh Authorized by: Gary Marsh Consent: Verbal consent obtained. Risks and benefits: risks,  benefits and alternatives were discussed Consent given by: patient Patient understanding: patient states understanding of the procedure being performed Patient consent: the patient's understanding of the procedure matches consent given Patient identity confirmed: verbally with patient Time out: Immediately prior to procedure a "time out" was called to verify the correct patient, procedure, equipment, support staff and site/side marked as required. Indications: joint swelling and pain  Body area: knee Joint: right knee Local anesthesia used: yes Anesthesia: local infiltration Local anesthetic: lidocaine 1% without epinephrine Anesthetic total: 6 ml Patient sedated: no Preparation: Patient was prepped and draped in the usual sterile fashion. Needle gauge: 18 G Approach: medial Aspirate: clear and yellow Aspirate amount: 8 ml Patient tolerance: Patient tolerated the procedure well with no immediate complications.   (including critical care time)  DIAGNOSTIC STUDIES: Oxygen Saturation is 97% on room air, normal by my interpretation.    COORDINATION OF CARE:  3:14 PM Discussed treatment plan which includes x-ray and lab work with pt at bedside and pt agreed to plan.    Labs Reviewed  COMPREHENSIVE METABOLIC PANEL - Abnormal; Notable for the following:    Total Bilirubin 0.2 (*)     All other components within normal limits  URIC ACID - Abnormal; Notable for the following:    Uric Acid, Serum 9.6 (*)     All other components within normal limits  CELL COUNT + DIFF,  W/ CRYST-SYNVL FLD - Abnormal; Notable for the following:    Color, Synovial YELLOW (*)     Appearance-Synovial HAZY (*)     WBC, Synovial 291 (*)     Monocyte-Macrophage-Synovial Fluid 95 (*)     All other components within normal limits  CBC WITH DIFFERENTIAL  BODY FLUID CRYSTAL  BODY FLUID CULTURE   Dg Knee Complete 4 Views Right  10/17/2012  *RADIOLOGY REPORT*  Clinical Data: Right-sided knee pain,  redness, swelling  RIGHT KNEE - COMPLETE 4+ VIEW  Comparison: None.  Findings: No fracture or dislocation.  No soft tissue abnormality. No radiopaque foreign body.  Trace suprapatellar fluid.  IMPRESSION: No acute osseous finding.   Original Report Authenticated By: [name], M.D.      1. Knee pain  MDM  Atraumatic right knee pain for 2 weeks. No fevers. No other joints involved. Saw orthopedist in [place] who wanted a second opinion. MRI on 12/16 showed mild joint effusion with hyper signal intensity changes in the medial femoral condyle suspicious for contusion  Knee joint is stable there is no warmth or cellulitis. Neurovascular intact distally. Doubt septic joint. Question gout.  Synovial fluid has 291 white cells. No gout crystals seen. Not consistent with septic arthritis. Culture sent. Still suspect gout as elevated uric acid and serum. We'll treat with anti-inflammatories, narcotics, followup with Dr. Romeo Apple. Knee immobilizer crutches given.  I personally performed the services described in this documentation, which was scribed in my presence. The recorded information has been reviewed and is accurate.   Gary Octave, MD 10/17/12 1958

## 2012-10-17 NOTE — ED Notes (Signed)
Right knee pain x 2 wks.  Denies injury.  Had MRI of knee in [place].  Pt has report and CD with him.  Moderate swelling  Noted to right knee.

## 2012-10-22 LAB — BODY FLUID CULTURE: Culture: NO GROWTH

## 2012-10-29 ENCOUNTER — Other Ambulatory Visit (HOSPITAL_COMMUNITY): Payer: Self-pay | Admitting: Orthopaedic Surgery

## 2012-10-29 DIAGNOSIS — M25569 Pain in unspecified knee: Secondary | ICD-10-CM

## 2012-10-30 ENCOUNTER — Ambulatory Visit (HOSPITAL_COMMUNITY): Payer: Self-pay

## 2012-10-30 ENCOUNTER — Other Ambulatory Visit (HOSPITAL_COMMUNITY): Payer: Self-pay | Admitting: Orthopaedic Surgery

## 2012-10-30 ENCOUNTER — Ambulatory Visit (HOSPITAL_COMMUNITY)
Admission: RE | Admit: 2012-10-30 | Discharge: 2012-10-30 | Disposition: A | Discharge status: Home / Self Care | Payer: Medicare Other | Source: Ambulatory Visit | Attending: Orthopaedic Surgery | Admitting: Orthopaedic Surgery

## 2012-10-30 DIAGNOSIS — M25561 Pain in right knee: Secondary | ICD-10-CM

## 2012-10-30 DIAGNOSIS — M25469 Effusion, unspecified knee: Secondary | ICD-10-CM | POA: Insufficient documentation

## 2012-10-30 DIAGNOSIS — M25569 Pain in unspecified knee: Secondary | ICD-10-CM | POA: Insufficient documentation

## 2012-10-30 DIAGNOSIS — M23302 Other meniscus derangements, unspecified lateral meniscus, unspecified knee: Secondary | ICD-10-CM | POA: Insufficient documentation

## 2012-10-30 DIAGNOSIS — M8430XA Stress fracture, unspecified site, initial encounter for fracture: Secondary | ICD-10-CM | POA: Insufficient documentation

## 2012-12-22 ENCOUNTER — Encounter: Payer: Self-pay | Admitting: Internal Medicine

## 2012-12-22 ENCOUNTER — Ambulatory Visit (HOSPITAL_COMMUNITY)
Admission: RE | Admit: 2012-12-22 | Discharge: 2012-12-22 | Disposition: A | Discharge status: Home / Self Care | Payer: Medicare Other | Source: Ambulatory Visit | Attending: Urgent Care | Admitting: Urgent Care

## 2012-12-22 ENCOUNTER — Ambulatory Visit (INDEPENDENT_AMBULATORY_CARE_PROVIDER_SITE_OTHER): Payer: Medicare Other | Admitting: Urgent Care

## 2012-12-22 VITALS — BP 142/81 | HR 95 | Temp 98.4°F | Ht 70.0 in | Wt 205.0 lb

## 2012-12-22 DIAGNOSIS — R109 Unspecified abdominal pain: Secondary | ICD-10-CM

## 2012-12-22 DIAGNOSIS — Q619 Cystic kidney disease, unspecified: Secondary | ICD-10-CM | POA: Insufficient documentation

## 2012-12-22 DIAGNOSIS — R112 Nausea with vomiting, unspecified: Secondary | ICD-10-CM | POA: Insufficient documentation

## 2012-12-22 DIAGNOSIS — R197 Diarrhea, unspecified: Secondary | ICD-10-CM | POA: Insufficient documentation

## 2012-12-22 DIAGNOSIS — N200 Calculus of kidney: Secondary | ICD-10-CM | POA: Insufficient documentation

## 2012-12-22 MED ORDER — IOHEXOL 300 MG/ML  SOLN
100.0000 mL | Freq: Once | INTRAMUSCULAR | Status: AC | PRN
  Administered 2012-12-22: 100 mL via INTRAVENOUS

## 2012-12-22 NOTE — Patient Instructions (Addendum)
CT scan abdomen & pelvis as soon as possible Return stools to the lab as soon as possible Please get your labs as soon as possible.  We will call you with results. Do not eat or drink until after CT scan To ER if severe pain

## 2012-12-22 NOTE — Assessment & Plan Note (Signed)
See abd pain.  

## 2012-12-22 NOTE — Assessment & Plan Note (Signed)
See abd pain.  Will follow up after CT with further recommendations.

## 2012-12-22 NOTE — Progress Notes (Signed)
No PCP on file 

## 2012-12-22 NOTE — Progress Notes (Signed)
Referring Provider: No ref. provider found Primary Care Physician:  No primary provider on file. Primary Gastroenterologist:  Dr. Jena Gauss  Chief Complaint  Patient presents with  . Diarrhea  . Emesis  . Abdominal Pain    HPI:  Gary Marsh is a 62 y.o. male here for abdominal pain & diarrhea.  He has not been seen you since May 2012.  About 1 week ago, he developed nausea and vomiting. He describes "terrible heartburn". He has ran out of Zantac, but he continues to take Nexium 40 mg daily. He complains of severe mid abdominal pain and left lower quadrant pain. He has had upwards of 4-5 loose watery stools per day.  He denies mucus or blood.  No ill contacts.  Denies fever.  +chills.  He has been having right knee pain and had a recent fracture. He was given Percocet which he believes was "hurting in stomach". He denies any NSAIDs.    Vitals - 1 value per visit 12/22/2012 10/17/2012 02/28/2011 10/23/2010  Weight (lb) 205 207 188.6 184   Past Medical History  Diagnosis Date  . MVP (mitral valve prolapse)   . DDD (degenerative disc disease)   . GERD (gastroesophageal reflux disease)   . Chronic back pain   . S/P endoscopy April 2010    small antral erosion  . S/P colonoscopy April 2010    left-sided diverticula, small rectal polyp  . Urinary hesitancy     Hx    Past Surgical History  Procedure Laterality Date  . Left inguinal hernia repair    . Back surgery x 5    . Appendectomy    . Esophagogastroduodenoscopy  02/20/2009    RMR: Normal esophagus.  A small antral ulceration status post biopsy   . Colonoscopy  02/20/2009    ION:GEXBMW polyp status post hot snare removal/ scattered left-sided diverticula    Current Outpatient Prescriptions  Medication Sig Dispense Refill  . atenolol (TENORMIN) 25 MG tablet Take 25 mg by mouth daily.        [name] esomeprazole (NEXIUM) 40 MG capsule Take 40 mg by mouth daily before breakfast.        . omega-3 acid ethyl esters (LOVAZA) 1 G capsule  Take 2 g by mouth 2 (two) times daily.        [name] oxyCODONE-acetaminophen (PERCOCET/ROXICET) 5-325 MG per tablet Take 2 tablets by mouth every 4 (four) hours as needed for pain.  15 tablet  0  . pregabalin (LYRICA) 150 MG capsule Take 150 mg by mouth 2 (two) times daily.         No current facility-administered medications for this visit.    Allergies as of 12/22/2012 - Review Complete 12/22/2012  Allergen Reaction Noted  . Carbamazepine Itching and Rash   . Duloxetine Itching and Rash     Review of Systems: Gen: see HPI CV: Denies chest pain, angina, palpitations, syncope, orthopnea, PND, peripheral edema, and claudication. Resp: Denies dyspnea at rest, dyspnea with exercise, cough, sputum, wheezing, coughing up blood, and pleurisy. GI: Denies vomiting blood, jaundice, and fecal incontinence.    Derm: Denies rash, itching, dry skin, hives, moles, warts, or unhealing ulcers.  Psych: Denies depression, anxiety, memory loss, suicidal ideation, hallucinations, paranoia, and confusion. Heme: Denies bruising, bleeding, and enlarged lymph nodes.  Physical Exam: BP 142/81  Pulse 95  Temp(Src) 98.4 F (36.9 C) (Oral)  Ht 5\' 10"  (1.778 m)  Wt 205 lb (92.987 kg)  BMI 29.41 kg/m2 General:   Alert,  Well-developed, obese, pleasant and cooperative in NAD Eyes:  Sclera clear, no icterus.   Conjunctiva pink. Mouth:  No deformity or lesions, oropharynx pink and moist. Neck:  Supple; no masses or thyromegaly. Heart:  Regular rate and rhythm; no murmurs, clicks, rubs,  or gallops. Abdomen:  Normal bowel sounds.  No bruits.  Soft, mildly distended.  +tenderness to LUQ, LLQ & umbilicus.  No masses, hepatosplenomegaly or hernias noted.  No guarding or rebound tenderness.   Rectal:  Deferred. Msk:  Symmetrical without gross deformities.  Pulses:  Normal pulses noted. Extremities:  No edema. Neurologic:  Alert and oriented x4;  grossly normal neurologically. Skin:  Intact without significant lesions  or rashes.

## 2012-12-22 NOTE — Assessment & Plan Note (Addendum)
Gary Marsh is a pleasant 62 y.o. male with severe left-sided abdominal pain, nausea, vomiting, terrible heartburn & diarrhea for 1 week.  He is quite tender on exam.  Differentials include protracted gastroenterocolitis, acute colitis, diverticulitis, less likely pancreatitis.  Will obtain full set of stool studies.  STAT CT abd/pelvis w/ IV/oral contrast.    Do not eat or drink until after CT scan To ER if severe pain

## 2012-12-23 LAB — CBC WITH DIFFERENTIAL/PLATELET
Eosinophils Relative: 5 % (ref 0–5)
HCT: 42.9 % (ref 39.0–52.0)
Lymphocytes Relative: 35 % (ref 12–46)
Lymphs Abs: 3.5 10*3/uL (ref 0.7–4.0)
MCH: 31.6 pg (ref 26.0–34.0)
MCV: 90.3 fL (ref 78.0–100.0)
Monocytes Absolute: 0.7 10*3/uL (ref 0.1–1.0)
RBC: 4.75 MIL/uL (ref 4.22–5.81)
RDW: 13.4 % (ref 11.5–15.5)
WBC: 9.9 10*3/uL (ref 4.0–10.5)

## 2012-12-23 LAB — COMPREHENSIVE METABOLIC PANEL
BUN: 14 mg/dL (ref 6–23)
CO2: 29 mEq/L (ref 19–32)
Calcium: 9.2 mg/dL (ref 8.4–10.5)
Chloride: 98 mEq/L (ref 96–112)
Creat: 0.83 mg/dL (ref 0.50–1.35)

## 2012-12-23 LAB — LIPASE: Lipase: 30 U/L (ref 0–75)

## 2012-12-23 LAB — POCT I-STAT, CHEM 8
Chloride: 101 mEq/L (ref 96–112)
HCT: 47 % (ref 39.0–52.0)
Potassium: 4.2 mEq/L (ref 3.5–5.1)
Sodium: 138 mEq/L (ref 135–145)

## 2012-12-23 MED ORDER — ONDANSETRON HCL 4 MG PO TABS: 4.0000 mg | ORAL_TABLET | Freq: Four times a day (QID) | ORAL | Status: DC | PRN

## 2012-12-23 NOTE — Progress Notes (Signed)
Quick Note:  Reviewed CT and lab results with patient. He did see Dr. Hilda Lias today for his right knee pain/fracture. He notes that oxycodone he has been given does seem to cause nausea. We will send Zofran prescription to [facility]. He will return stool specimens to lab if diarrhea persists, however today he has had 2 formed stools. He is advised that he does not need to return stool studies if diarrhea resolves. Cc:No primary provider on file.  ______

## 2012-12-23 NOTE — Addendum Note (Signed)
Addended by: Joselyn Arrow on: 12/23/2012 11:45 AM   Modules accepted: Orders

## 2012-12-23 NOTE — Progress Notes (Signed)
Quick Note:  Await stools ______ 

## 2012-12-26 ENCOUNTER — Emergency Department (HOSPITAL_COMMUNITY): Payer: Medicare Other

## 2012-12-26 ENCOUNTER — Emergency Department (HOSPITAL_COMMUNITY)
Admission: EM | Admit: 2012-12-26 | Discharge: 2012-12-26 | Disposition: A | Discharge status: Home / Self Care | Payer: Medicare Other | Attending: Emergency Medicine | Admitting: Emergency Medicine

## 2012-12-26 ENCOUNTER — Encounter (HOSPITAL_COMMUNITY): Payer: Self-pay

## 2012-12-26 DIAGNOSIS — Z8739 Personal history of other diseases of the musculoskeletal system and connective tissue: Secondary | ICD-10-CM | POA: Insufficient documentation

## 2012-12-26 DIAGNOSIS — R109 Unspecified abdominal pain: Secondary | ICD-10-CM | POA: Insufficient documentation

## 2012-12-26 DIAGNOSIS — J189 Pneumonia, unspecified organism: Secondary | ICD-10-CM | POA: Insufficient documentation

## 2012-12-26 DIAGNOSIS — Z79899 Other long term (current) drug therapy: Secondary | ICD-10-CM | POA: Insufficient documentation

## 2012-12-26 DIAGNOSIS — Z87448 Personal history of other diseases of urinary system: Secondary | ICD-10-CM | POA: Insufficient documentation

## 2012-12-26 DIAGNOSIS — Z8679 Personal history of other diseases of the circulatory system: Secondary | ICD-10-CM | POA: Insufficient documentation

## 2012-12-26 DIAGNOSIS — G8929 Other chronic pain: Secondary | ICD-10-CM | POA: Insufficient documentation

## 2012-12-26 DIAGNOSIS — K219 Gastro-esophageal reflux disease without esophagitis: Secondary | ICD-10-CM | POA: Insufficient documentation

## 2012-12-26 DIAGNOSIS — R112 Nausea with vomiting, unspecified: Secondary | ICD-10-CM | POA: Insufficient documentation

## 2012-12-26 DIAGNOSIS — M549 Dorsalgia, unspecified: Secondary | ICD-10-CM | POA: Insufficient documentation

## 2012-12-26 LAB — COMPREHENSIVE METABOLIC PANEL
Albumin: 3.9 g/dL (ref 3.5–5.2)
Alkaline Phosphatase: 95 U/L (ref 39–117)
BUN: 14 mg/dL (ref 6–23)
Calcium: 9.1 mg/dL (ref 8.4–10.5)
Creatinine, Ser: 0.91 mg/dL (ref 0.50–1.35)
GFR calc Af Amer: >90 mL/min (ref >90)
Glucose, Bld: 149 mg/dL — ABNORMAL HIGH (ref 70–99)
Potassium: 3.9 mEq/L (ref 3.5–5.1)
Total Protein: 7.6 g/dL (ref 6.0–8.3)

## 2012-12-26 LAB — CBC WITH DIFFERENTIAL/PLATELET
Basophils Relative: 0 % (ref 0–1)
Eosinophils Absolute: 0.4 10*3/uL (ref 0.0–0.7)
Eosinophils Relative: 5 % (ref 0–5)
Hemoglobin: 16 g/dL (ref 13.0–17.0)
Lymphs Abs: 2.9 10*3/uL (ref 0.7–4.0)
MCH: 32.4 pg (ref 26.0–34.0)
MCHC: 35.6 g/dL (ref 30.0–36.0)
MCV: 90.9 fL (ref 78.0–100.0)
Monocytes Relative: 4 % (ref 3–12)
Neutrophils Relative %: 56 % (ref 43–77)

## 2012-12-26 LAB — TROPONIN I: Troponin I: <0.3 ng/mL (ref <0.30)

## 2012-12-26 LAB — LIPASE, BLOOD: Lipase: 19 U/L (ref 11–59)

## 2012-12-26 LAB — LACTIC ACID, PLASMA: Lactic Acid, Venous: 2.3 mmol/L — ABNORMAL HIGH (ref 0.5–2.2)

## 2012-12-26 MED ORDER — ONDANSETRON HCL 4 MG/2ML IJ SOLN
4.0000 mg | Freq: Once | INTRAMUSCULAR | Status: AC
  Administered 2012-12-26: 4 mg via INTRAVENOUS
  Filled 2012-12-26: qty 2

## 2012-12-26 MED ORDER — METOCLOPRAMIDE HCL 5 MG/ML IJ SOLN
INTRAMUSCULAR | Status: AC
  Administered 2012-12-26: 10 mg via INTRAVENOUS
  Filled 2012-12-26: qty 2

## 2012-12-26 MED ORDER — SODIUM CHLORIDE 0.9 % IV BOLUS (SEPSIS)
1000.0000 mL | Freq: Once | INTRAVENOUS | Status: AC
  Administered 2012-12-26: 1000 mL via INTRAVENOUS

## 2012-12-26 MED ORDER — AZITHROMYCIN 250 MG PO TABS: ORAL_TABLET | ORAL | Status: DC

## 2012-12-26 MED ORDER — METOCLOPRAMIDE HCL 5 MG/ML IJ SOLN: 10.0000 mg | Freq: Once | INTRAMUSCULAR | Status: AC

## 2012-12-26 NOTE — ED Notes (Signed)
Pt profusely vomiting after receiving IV zofran.  edp notified, orders received.

## 2012-12-26 NOTE — ED Provider Notes (Signed)
History    This chart was scribed for Gary Crease, MD by Charolett Bumpers, ED Scribe. The patient was seen in room APA18/APA18. Patient's care was started at 1339.   CSN: [id number]  Arrival date & time 12/26/12  1332   First MD Initiated Contact with Patient 12/26/12 1339      Chief Complaint  Patient presents with  . Chest Pain    HPI Comments: Gary Marsh is a 62 y.o. male who presents to the Emergency Department complaining of moderate upper abdominal pain that goes all the way across. He states the pain has been constant since last night put has had intermittent pain prior. He reports associated nausea and vomiting. He denies any diarrhea, constipation, dysuria, cough, sore throat, chest pain. He is unsure of any fevers. He was seen on 2/25 where a CT scan was preformed after he had similar symptoms. He states that he was recently hospitalized for pneumonia.     The history is provided by the patient. No language interpreter was used.    Past Medical History  Diagnosis Date  . MVP (mitral valve prolapse)   . DDD (degenerative disc disease)   . GERD (gastroesophageal reflux disease)   . Chronic back pain   . S/P endoscopy April 2010    small antral erosion  . S/P colonoscopy April 2010    left-sided diverticula, small rectal polyp  . Urinary hesitancy     Hx    Past Surgical History  Procedure Laterality Date  . Left inguinal hernia repair    . Back surgery x 5    . Appendectomy    . Esophagogastroduodenoscopy  02/20/2009    RMR: Normal esophagus.  A small antral ulceration status post biopsy   . Colonoscopy  02/20/2009    BJY:NWGNFA polyp status post hot snare removal/ scattered left-sided diverticula    Family History  Problem Relation Age of Onset  . Colon cancer Mother     deceased age 70    History  Substance Use Topics  . Smoking status: Never Smoker   . Smokeless tobacco: Not on file  . Alcohol Use: No      Review of Systems   Respiratory: Negative for cough and shortness of breath.   Cardiovascular: Negative for chest pain.  Gastrointestinal: Positive for nausea, vomiting and abdominal pain. Negative for diarrhea and constipation.  Genitourinary: Negative for dysuria and difficulty urinating.  All other systems reviewed and are negative.    Allergies  Carbamazepine and Duloxetine  Home Medications   Current Outpatient Rx  Name  Route  Sig  Dispense  Refill  . atenolol (TENORMIN) 25 MG tablet   Oral   Take 25 mg by mouth daily.           [name] esomeprazole (NEXIUM) 40 MG capsule   Oral   Take 40 mg by mouth daily before breakfast.           . omega-3 acid ethyl esters (LOVAZA) 1 G capsule   Oral   Take 2 g by mouth 2 (two) times daily.           . ondansetron (ZOFRAN) 4 MG tablet   Oral   Take 1 tablet (4 mg total) by mouth every 6 (six) hours as needed for nausea.   30 tablet   0   . oxyCODONE-acetaminophen (PERCOCET/ROXICET) 5-325 MG per tablet   Oral   Take 2 tablets by mouth every 4 (four) hours as needed for  pain.   15 tablet   0   . pregabalin (LYRICA) 150 MG capsule   Oral   Take 150 mg by mouth 2 (two) times daily.             BP 138/84  Pulse 116  Temp(Src) 98.6 F (37 C) (Oral)  Resp 22  Ht 5\' 10"  (1.778 m)  Wt 205 lb (92.987 kg)  BMI 29.41 kg/m2  SpO2 97%  Physical Exam  Constitutional: He is oriented to person, place, and time. He appears well-developed and well-nourished. No distress.  HENT:  Head: Normocephalic and atraumatic.  Right Ear: Hearing normal.  Left Ear: Hearing normal.  Nose: Nose normal.  Mouth/Throat: Oropharynx is clear and moist and mucous membranes are normal.  Eyes: Conjunctivae and EOM are normal. Pupils are equal, round, and reactive to light.  Neck: Normal range of motion. Neck supple.  Cardiovascular: Normal rate, regular rhythm, S1 normal, S2 normal and normal heart sounds.  Exam reveals no gallop and no friction rub.   No murmur  heard. Pulmonary/Chest: Effort normal and breath sounds normal. No respiratory distress. He exhibits no tenderness.  Abdominal: Soft. Normal appearance and bowel sounds are normal. There is no hepatosplenomegaly. There is tenderness. There is no rebound, no guarding, no tenderness at McBurney's point and negative Murphy's sign. No hernia.  Moderate RUQ, epigastric and LUQ abdominal tenderness.   Musculoskeletal: Normal range of motion.  Neurological: He is alert and oriented to person, place, and time. He has normal strength. No cranial nerve deficit or sensory deficit. Coordination normal. GCS eye subscore is 4. GCS verbal subscore is 5. GCS motor subscore is 6.  Skin: Skin is warm, dry and intact. No rash noted. No cyanosis.  Psychiatric: He has a normal mood and affect. His behavior is normal. Thought content normal.    ED Course  Procedures (including critical care time)   Date: 12/26/2012  Rate: 116  Rhythm: sinus tachycardia  QRS Axis: normal  Intervals: normal  ST/T Wave abnormalities: nonspecific ST/T changes  Conduction Disutrbances:none  Narrative Interpretation:   Old EKG Reviewed: changes noted and tachycardia    DIAGNOSTIC STUDIES: Oxygen Saturation is 97% on room air, normal by my interpretation.    COORDINATION OF CARE:  13:50-Discussed planned course of treatment with the patient including IV fluids, Zofran, CBC with diff, CMP, UA and checking troponin, lactic acid and lipase who is agreeable at this time.   14:00-Medication Orders: Sodium chloride 0.9% bolus 1,000 mL-once; Ondansetron (Zofran) injection 4 mg-once.     Labs Reviewed  COMPREHENSIVE METABOLIC PANEL - Abnormal; Notable for the following:    Glucose, Bld 149 (*)    All other components within normal limits  LACTIC ACID, PLASMA - Abnormal; Notable for the following:    Lactic Acid, Venous 2.3 (*)    All other components within normal limits  CBC WITH DIFFERENTIAL  TROPONIN I  LIPASE, BLOOD   URINALYSIS, ROUTINE W REFLEX MICROSCOPIC   Dg Chest 2 View  12/26/2012  *RADIOLOGY REPORT*  Clinical Data: Recent pneumonia, vomiting  CHEST - 2 VIEW  Comparison: CT chest dated 10/05/2010  Findings: Mild patchy opacity at the lateral left lung base, suspicious for pneumonia. No pleural effusion or pneumothorax.  Cardiomediastinal silhouette is within normal limits.  Visualized osseous structures are within normal limits.  IMPRESSION: Mild patchy opacity in the lateral left lung base, suspicious for pneumonia.   Original Report Authenticated By: Charline Bills, M.D.      Diagnoses: 1.  Abdominal pain 2. Vomiting 3. Pneumonia    MDM  Patient comes to the ER for evaluation of persistent abdominal pain, nausea and vomiting. Patient is taking Zofran and oxycodone at home, has had persistent symptoms. He did see his "stomach doctor" this week and had a CAT scan which didn't show any abnormalities. Patient reports that the medications are not helping. Abdominal exam revealed diffuse upper abdominal tenderness without guarding or rebound. Symptoms have improved with Zofran, Reglan and IV fluids.  Patient reports that he was admitted in [place] a couple of weeks ago for pneumonia. He was treated with Ceclor. A chest x-ray was performed and he has some persistent opacity at the left base. This could be atypical pneumonia, not treated by the Ceclor. Patient prescribed Zithromax. He will require primary care followup to assure that this resolves. If not he would require further evaluation. Patient given primary care contact.    I personally performed the services described in this documentation, which was scribed in my presence. The recorded information has been reviewed and is accurate.   Gary Crease, MD 12/26/12 [phone]

## 2012-12-26 NOTE — ED Notes (Signed)
Pt unable to urinate at this time. Pt was resting also. Gary Marsh

## 2012-12-26 NOTE — ED Notes (Signed)
Pt states he is having chest pain that started last night. Indicates pain in epigastric area. States he can't swallow. Was scoped this past thursday

## 2013-08-03 ENCOUNTER — Ambulatory Visit: Payer: PRIVATE HEALTH INSURANCE | Admitting: Gastroenterology

## 2013-08-03 ENCOUNTER — Telehealth: Payer: Self-pay | Admitting: Gastroenterology

## 2013-08-03 NOTE — Telephone Encounter (Signed)
Please send letter for f/u.  

## 2013-08-03 NOTE — Telephone Encounter (Signed)
Pt was a no show

## 2013-08-06 ENCOUNTER — Encounter: Payer: Self-pay | Admitting: Gastroenterology

## 2013-08-06 NOTE — Telephone Encounter (Signed)
Mailed letter °

## 2014-08-01 ENCOUNTER — Encounter (HOSPITAL_COMMUNITY): Payer: Self-pay | Admitting: Emergency Medicine

## 2014-08-01 ENCOUNTER — Emergency Department (HOSPITAL_COMMUNITY)
Admission: EM | Admit: 2014-08-01 | Discharge: 2014-08-01 | Disposition: A | Discharge status: Home / Self Care | Payer: Medicare Other | Attending: Emergency Medicine | Admitting: Emergency Medicine

## 2014-08-01 DIAGNOSIS — K219 Gastro-esophageal reflux disease without esophagitis: Secondary | ICD-10-CM | POA: Insufficient documentation

## 2014-08-01 DIAGNOSIS — Z9889 Other specified postprocedural states: Secondary | ICD-10-CM | POA: Insufficient documentation

## 2014-08-01 DIAGNOSIS — G8929 Other chronic pain: Secondary | ICD-10-CM | POA: Diagnosis not present

## 2014-08-01 DIAGNOSIS — Z8679 Personal history of other diseases of the circulatory system: Secondary | ICD-10-CM | POA: Diagnosis not present

## 2014-08-01 DIAGNOSIS — Z79899 Other long term (current) drug therapy: Secondary | ICD-10-CM | POA: Insufficient documentation

## 2014-08-01 DIAGNOSIS — T7840XA Allergy, unspecified, initial encounter: Secondary | ICD-10-CM | POA: Insufficient documentation

## 2014-08-01 DIAGNOSIS — L299 Pruritus, unspecified: Secondary | ICD-10-CM | POA: Diagnosis present

## 2014-08-01 MED ORDER — LORAZEPAM 2 MG/ML IJ SOLN
1.0000 mg | Freq: Once | INTRAMUSCULAR | Status: AC
  Administered 2014-08-01: 1 mg via INTRAMUSCULAR
  Filled 2014-08-01: qty 1

## 2014-08-01 MED ORDER — HYDROXYZINE HCL 25 MG PO TABS
25.0000 mg | ORAL_TABLET | Freq: Once | ORAL | Status: AC
  Administered 2014-08-01: 25 mg via ORAL
  Filled 2014-08-01: qty 1

## 2014-08-01 MED ORDER — DIPHENHYDRAMINE HCL 25 MG PO CAPS
25.0000 mg | ORAL_CAPSULE | Freq: Once | ORAL | Status: AC
  Administered 2014-08-01: 25 mg via ORAL
  Filled 2014-08-01: qty 1

## 2014-08-01 MED ORDER — PREDNISONE 10 MG PO TABS: ORAL_TABLET | ORAL | Status: DC

## 2014-08-01 MED ORDER — HYDROXYZINE HCL 25 MG PO TABS: 25.0000 mg | ORAL_TABLET | Freq: Four times a day (QID) | ORAL | Status: DC

## 2014-08-01 NOTE — ED Notes (Addendum)
Pt with constant itching to face and scalp since last night, states Benadryl this morning did not help; pt denies any medications or soaps, pt denies any SOB or difficultly swallowing

## 2014-08-01 NOTE — Discharge Instructions (Signed)
Drug Allergy °Allergic reactions to medicines are common. Some allergic reactions are mild. A delayed type of drug allergy that occurs 1 week or more after exposure to a medicine or vaccine is called serum sickness. A life-threatening, sudden (acute) allergic reaction that involves the whole body is called anaphylaxis. °CAUSES  °"True" drug allergies occur when there is an allergic reaction to a medicine. This is caused by overactivity of the immune system. First, the body becomes sensitized. The immune system is triggered by your first exposure to the medicine. Following this first exposure, future exposure to the same medicine may be life-threatening. °Almost any medicine can cause an allergic reaction. Common ones are: °· Penicillin. °· Sulfonamides (sulfa drugs). °· Local anesthetics. °· X-ray dyes that contain iodine. °SYMPTOMS  °Common symptoms of a minor allergic reaction are: °· Swelling around the mouth. °· An itchy red rash or hives. °· Vomiting or diarrhea. °Anaphylaxis can cause swelling of the mouth and throat. This makes it difficult to breathe and swallow. Severe reactions can be fatal within seconds, even after exposure to only a trace amount of the drug that causes the reaction. °HOME CARE INSTRUCTIONS  °· If you are unsure of what caused your reaction, keep a diary of foods and medicines used. Include the symptoms that followed. Avoid anything that causes reactions. °· You may want to follow up with an allergy specialist after the reaction has cleared in order to be tested to confirm the allergy. It is important to confirm that your reaction is an allergy, not just a side effect to the medicine. If you have a true allergy to a medicine, this may prevent that medicine and related medicines from being given to you when you are very ill. °· If you have hives or a rash: °¨ Take medicines as directed by your caregiver. °¨ You may use an over-the-counter antihistamine (diphenhydramine) as  needed. °¨ Apply cold compresses to the skin or take baths in cool water. Avoid hot baths or showers. °· If you are severely allergic: °¨ Continuous observation after a severe reaction may be needed. Hospitalization is often required. °¨ Wear a medical alert bracelet or necklace stating your allergy. °¨ You and your family must learn how to use an anaphylaxis kit or give an epinephrine injection to temporarily treat an emergency allergic reaction. If you have had a severe reaction, always carry your epinephrine injection or anaphylaxis kit with you. This can be lifesaving if you have a severe reaction. °· Do not drive or perform tasks after treatment until the medicines used to treat your reaction have worn off, or until your caregiver says it is okay. °SEEK MEDICAL CARE IF:  °· You think you had an allergic reaction. Symptoms usually start within 30 minutes after exposure. °· Symptoms are getting worse rather than better. °· You develop new symptoms. °· The symptoms that brought you to your caregiver return. °SEEK IMMEDIATE MEDICAL CARE IF:  °· You have swelling of the mouth, difficulty breathing, or wheezing. °· You have a tight feeling in your chest or throat. °· You develop hives, swelling, or itching all over your body. °· You develop severe vomiting or diarrhea. °· You feel faint or pass out. °This is an emergency. Use your epinephrine injection or anaphylaxis kit as you have been instructed. Call for emergency medical help. Even if you improve after the injection, you need to be examined at a hospital emergency department. °MAKE SURE YOU:  °· Understand these instructions. °· Will watch   your condition.  Will get help right away if you are not doing well or get worse. Document Released: 10/14/2005 Document Revised: 01/06/2012 Document Reviewed: 03/20/2011 [facility] Patient Information 2015 [place], [place]. This information is not intended to replace advice given to you by your health care provider. Make  sure you discuss any questions you have with your health care provider.

## 2014-08-01 NOTE — ED Notes (Signed)
Itching of face and scalp, onset last night, Took benadryl 50 mg 6 am Denies new soaps or meds.No trouble with breathing or  swallowning

## 2014-08-01 NOTE — ED Provider Notes (Signed)
CSN: [id number]     Arrival date & time 08/01/14  1254 History  This chart was scribed for non-physician practitioner, Pauline Aus, PA-C,working with Donnetta Hutching, MD, by Karle Plumber, ED Scribe. This patient was seen in room APFT24/AP[facility] and the patient's care was started at 1:42 PM.  Chief Complaint  Patient presents with  . Pruritis   The history is provided by the patient. No language interpreter was used.   HPI Comments:  Gary Marsh is a 63 y.o. male with PMHx of DDD, chronic back pain and mitral valve prolapse who presents to the Emergency Department complaining of severe itching on his face, scalp and bilateral shoulder and upper arms that began last night. Pt reports having a flu vaccination three days ago. He reports taking Benadryl 50 mg approximately 7 hours ago with no significant relief of the itching. He denies any alleviating or worsening factors. 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 He denies any new soaps, creams, lotions, detergents, medications, foods, pet exposures, personal hygiene products or any other new products. He denies SOB, difficulty swallowing or breathing, abdominal pain, CP, fever or rash.  Past Medical History  Diagnosis Date  . MVP (mitral valve prolapse)   . DDD (degenerative disc disease)   . GERD (gastroesophageal reflux disease)   . Chronic back pain   . S/P endoscopy April 2010    small antral erosion  . S/P colonoscopy April 2010    left-sided diverticula, small rectal polyp  . Urinary hesitancy     Hx   Past Surgical History  Procedure Laterality Date  . Left inguinal hernia repair    . Back surgery x 5    . Appendectomy    . Esophagogastroduodenoscopy  02/20/2009    RMR: Normal esophagus.  A small antral ulceration status post biopsy   . Colonoscopy  02/20/2009    [id number] polyp status post hot snare removal/ scattered left-sided diverticula  . Cholecystectomy    . Hernia repair     Family History   Problem Relation Age of Onset  . Colon cancer Mother     deceased age 60   History  Substance Use Topics  . Smoking status: Never Smoker   . Smokeless tobacco: Not on file  . Alcohol Use: No    Review of Systems  Constitutional: Negative for fever, chills and fatigue.  HENT: Negative for sore throat and trouble swallowing.   Respiratory: Negative for cough, shortness of breath and wheezing.   Cardiovascular: Negative for chest pain and palpitations.  Gastrointestinal: Negative for nausea, vomiting, abdominal pain and blood in stool.  Genitourinary: Negative for dysuria, hematuria and flank pain.  Musculoskeletal: Negative for arthralgias, back pain, myalgias, neck pain and neck stiffness.  Skin: Negative for rash.       Itching to face, scalp and upper arms and shoulders  Neurological: Negative for dizziness, weakness and numbness.  Hematological: Does not bruise/bleed easily.    Allergies  Carbamazepine and Duloxetine  Home Medications   Prior to Admission medications   Medication Sig Start Date End Date Taking? Authorizing Provider  atenolol (TENORMIN) 25 MG tablet Take 25 mg by mouth daily.     Yes Historical Provider, MD  diphenhydrAMINE (BENADRYL) 25 mg capsule Take 25 mg by mouth every 6 (six) hours as needed for itching.   Yes Historical Provider, MD  esomeprazole (NEXIUM) 40 MG capsule Take 40 mg by mouth 2 (two) times daily.    Yes Historical Provider,  MD  pregabalin (LYRICA) 150 MG capsule Take 150 mg by mouth 3 (three) times daily.    Yes Historical Provider, MD   Triage Vitals: BP 128/81  Pulse 123  Temp(Src) 99 F (37.2 C) (Oral)  Resp 20  Ht 5\' 10"  (1.778 m)  Wt 190 lb (86.183 kg)  BMI 27.26 kg/m2  SpO2 100% Physical Exam  Nursing note and vitals reviewed. Constitutional: He is oriented to person, place, and time. He appears well-developed and well-nourished.  HENT:  Head: Normocephalic and atraumatic.  Mouth/Throat: Uvula is midline, oropharynx is  clear and moist and mucous membranes are normal. No oral lesions. No trismus in the jaw. No uvula swelling.  Eyes: Conjunctivae and EOM are normal. Pupils are equal, round, and reactive to light.  Neck: Normal range of motion. Neck supple.  Cardiovascular: Normal rate, regular rhythm, normal heart sounds and intact distal pulses.   No murmur heard. Pulmonary/Chest: Effort normal and breath sounds normal. No respiratory distress. He has no wheezes.  Airway intact.  Musculoskeletal: Normal range of motion.  Lymphadenopathy:    He has no cervical adenopathy.  Neurological: He is alert and oriented to person, place, and time. He exhibits normal muscle tone. Coordination normal.  Skin: Skin is warm and dry.  Few excoriations bilateral shoulders. No erythema, rash or edema.  Psychiatric: He has a normal mood and affect. His behavior is normal.    ED Course  Procedures (including critical care time) DIAGNOSTIC STUDIES: Oxygen Saturation is 100% on RA, normal by my interpretation.   COORDINATION OF CARE: 1:46 PM- Will give injection of Ativan and oral Benadryl. Pt verbalizes understanding and agrees to plan.  Medications  LORazepam (ATIVAN) injection 1 mg (1 mg Intramuscular Given 08/01/14 1400)  diphenhydrAMINE (BENADRYL) capsule 25 mg (25 mg Oral Given 08/01/14 1359)  hydrOXYzine (ATARAX/VISTARIL) tablet 25 mg (25 mg Oral Given 08/01/14 1448)    Labs Review Labs Reviewed - No data to display  Imaging Review No results found.   EKG Interpretation None      MDM   Final diagnoses:  Allergic reaction, initial encounter    Pt with pruritis of the shoulder, scalp and face.  No erythema, edema, or hives.  Airway remains patent.  Pt is feeling better after medications.  He agrees to close f/u with his PMD or to return here if needed.  Care plan discussed with Dr. Adriana Simasook.  rx for prednisone taper and vistaril.  Pt appears stable for d/c  I personally performed the services described in  this documentation, which was scribed in my presence. The recorded information has been reviewed and is accurate.    Jahmil Macleod L. Trisha Mangleriplett, PA-C 08/02/14 2110

## 2014-08-01 NOTE — ED Notes (Signed)
Pt's daughter reports that pt had a flu shot 5 days ago

## 2014-08-03 NOTE — ED Provider Notes (Signed)
Medical screening examination/treatment/procedure(s) were conducted as a shared visit with non-physician practitioner(s) and myself.  I personally evaluated the patient during the encounter.   EKG Interpretation None     Uncertain etiology of pruritus. Could be allergic phenomena. No airway issues. Rx prednisolone, Benadryl, Pepcid.  Discharge medications prednisone  Donnetta HutchingBrian Cylie Dor, MD 08/03/14 [phone]

## 2017-05-22 ENCOUNTER — Emergency Department (HOSPITAL_COMMUNITY): Payer: Medicare Other

## 2017-05-22 ENCOUNTER — Encounter (HOSPITAL_COMMUNITY): Payer: Self-pay | Admitting: Emergency Medicine

## 2017-05-22 ENCOUNTER — Emergency Department (HOSPITAL_COMMUNITY)
Admission: EM | Admit: 2017-05-22 | Discharge: 2017-05-22 | Disposition: A | Discharge status: Home / Self Care | Payer: Medicare Other | Attending: Emergency Medicine | Admitting: Emergency Medicine

## 2017-05-22 DIAGNOSIS — Z79899 Other long term (current) drug therapy: Secondary | ICD-10-CM | POA: Diagnosis not present

## 2017-05-22 DIAGNOSIS — K567 Ileus, unspecified: Secondary | ICD-10-CM

## 2017-05-22 DIAGNOSIS — R1013 Epigastric pain: Secondary | ICD-10-CM | POA: Diagnosis present

## 2017-05-22 LAB — CBC
HEMATOCRIT: 47.2 % (ref 39.0–52.0)
HEMOGLOBIN: 16.6 g/dL (ref 13.0–17.0)
MCH: 32.5 pg (ref 26.0–34.0)
MCHC: 35.2 g/dL (ref 30.0–36.0)
MCV: 92.5 fL (ref 78.0–100.0)
Platelets: 214 10*3/uL (ref 150–400)
RBC: 5.1 MIL/uL (ref 4.22–5.81)
RDW: 13.7 % (ref 11.5–15.5)
WBC: 10.3 10*3/uL (ref 4.0–10.5)

## 2017-05-22 LAB — COMPREHENSIVE METABOLIC PANEL
ALT: 28 U/L (ref 17–63)
ANION GAP: 8 (ref 5–15)
AST: 23 U/L (ref 15–41)
Albumin: 4.1 g/dL (ref 3.5–5.0)
Alkaline Phosphatase: 65 U/L (ref 38–126)
BILIRUBIN TOTAL: 1 mg/dL (ref 0.3–1.2)
BUN: 10 mg/dL (ref 6–20)
CO2: 29 mmol/L (ref 22–32)
Calcium: 8.8 mg/dL — ABNORMAL LOW (ref 8.9–10.3)
Chloride: 99 mmol/L — ABNORMAL LOW (ref 101–111)
Creatinine, Ser: 1.16 mg/dL (ref 0.61–1.24)
Glucose, Bld: 104 mg/dL — ABNORMAL HIGH (ref 65–99)
POTASSIUM: 3.9 mmol/L (ref 3.5–5.1)
Sodium: 136 mmol/L (ref 135–145)
TOTAL PROTEIN: 7.6 g/dL (ref 6.5–8.1)

## 2017-05-22 LAB — URINALYSIS, ROUTINE W REFLEX MICROSCOPIC
BACTERIA UA: NONE SEEN
BILIRUBIN URINE: NEGATIVE
Glucose, UA: NEGATIVE mg/dL
HGB URINE DIPSTICK: NEGATIVE
Ketones, ur: NEGATIVE mg/dL
LEUKOCYTES UA: NEGATIVE
NITRITE: NEGATIVE
Protein, ur: 30 mg/dL — AB
SPECIFIC GRAVITY, URINE: 1.023 (ref 1.005–1.030)
pH: 6 (ref 5.0–8.0)

## 2017-05-22 LAB — LIPASE, BLOOD: Lipase: 23 U/L (ref 11–51)

## 2017-05-22 MED ORDER — HYDROMORPHONE HCL 1 MG/ML IJ SOLN
1.0000 mg | Freq: Once | INTRAMUSCULAR | Status: AC
Start: 2017-05-22 — End: 2017-05-22
  Administered 2017-05-22: 1 mg via INTRAVENOUS
  Filled 2017-05-22: qty 1

## 2017-05-22 MED ORDER — ONDANSETRON 4 MG PO TBDP: ORAL_TABLET | ORAL | 0 refills | Status: DC

## 2017-05-22 MED ORDER — SODIUM CHLORIDE 0.9 % IV BOLUS (SEPSIS)
1000.0000 mL | Freq: Once | INTRAVENOUS | Status: AC
  Administered 2017-05-22: 1000 mL via INTRAVENOUS

## 2017-05-22 MED ORDER — ONDANSETRON HCL 4 MG/2ML IJ SOLN
4.0000 mg | Freq: Once | INTRAMUSCULAR | Status: AC
  Administered 2017-05-22: 4 mg via INTRAVENOUS
  Filled 2017-05-22: qty 2

## 2017-05-22 MED ORDER — IOPAMIDOL (ISOVUE-300) INJECTION 61%
100.0000 mL | Freq: Once | INTRAVENOUS | Status: AC | PRN
  Administered 2017-05-22: 100 mL via INTRAVENOUS

## 2017-05-22 MED ORDER — HYDROCODONE-ACETAMINOPHEN 5-325 MG PO TABS: 1.0000 | ORAL_TABLET | Freq: Four times a day (QID) | ORAL | 0 refills | Status: AC | PRN

## 2017-05-22 NOTE — ED Provider Notes (Signed)
[facility]PT Provider Note   CSN: [id number] Arrival date & time: 05/22/17  1459     History   Chief Complaint Chief Complaint  Patient presents with  . Abdominal Pain    HPI Viral Schramm is a 66 y.o. male.  Patient states that he's had some abdominal discomfort and nausea for a day. He has had a couple bowel obstructions before but has not had surgery for them   The history is provided by the patient.  Abdominal Pain   This is a new problem. The current episode started 12 to 24 hours ago. The problem occurs constantly. The problem has been gradually improving. The pain is associated with an unknown factor. The pain is located in the epigastric region. The quality of the pain is dull. Associated symptoms include nausea. Pertinent negatives include diarrhea, frequency, hematuria and headaches.    Past Medical History:  Diagnosis Date  . Chronic back pain   . DDD (degenerative disc disease)   . GERD (gastroesophageal reflux disease)   . MVP (mitral valve prolapse)   . S/P colonoscopy April 2010   left-sided diverticula, small rectal polyp  . S/P endoscopy April 2010   small antral erosion  . Urinary hesitancy    Hx    Patient Active Problem List   Diagnosis Date Noted  . Hemorrhoid 03/03/2011  . LLQ abdominal pain 03/03/2011  . CHEST PAIN UNSPECIFIED 10/16/2010  . MELENA 04/05/2009  . DIARRHEA 04/05/2009  . ABDOMINAL PAIN OTHER SPECIFIED SITE 04/05/2009  . GERD 02/03/2009  . DEGENERATIVE DISC DISEASE 02/03/2009  . BACK PAIN, CHRONIC 02/03/2009  . NAUSEA WITH VOMITING 02/03/2009  . URINARY HESITANCY 02/03/2009  . ABDOMINAL PAIN 02/03/2009  . MITRAL VALVE PROLAPSE, HX OF 02/03/2009    Past Surgical History:  Procedure Laterality Date  . APPENDECTOMY    . back surgery X 5    . CHOLECYSTECTOMY    . COLONOSCOPY  02/20/2009   [id number] polyp status post hot snare removal/ scattered left-sided diverticula  . ESOPHAGOGASTRODUODENOSCOPY  02/20/2009   RMR: Normal esophagus.  A small antral ulceration status post biopsy   . HERNIA REPAIR    . left inguinal hernia repair         Home Medications    Prior to Admission medications   Medication Sig Start Date End Date Taking? Authorizing Provider  diphenhydrAMINE (BENADRYL) 25 mg capsule Take 25 mg by mouth every 6 (six) hours as needed for itching.   Yes [provider]  ondansetron (ZOFRAN) 4 MG tablet Take 4 mg by mouth every 8 (eight) hours as needed for nausea or vomiting.   Yes [provider]  pregabalin (LYRICA) 150 MG capsule Take 150 mg by mouth 3 (three) times daily.    Yes [provider]  UNKNOWN TO PATIENT Take 1 tablet by mouth daily as needed. Patient states it is a fluid pill   Yes [provider]  atenolol (TENORMIN) 25 MG tablet Take 25 mg by mouth daily.      [provider]  HYDROcodone-acetaminophen (NORCO/VICODIN) 5-325 MG tablet Take 1 tablet by mouth every 6 (six) hours as needed for moderate pain. 05/22/17   Bethann Berkshire, MD  ondansetron (ZOFRAN ODT) 4 MG disintegrating tablet 4mg  ODT q4 hours prn nausea/vomit 05/22/17   Bethann Berkshire, MD    Family History Family History  Problem Relation Age of Onset  . Colon cancer Mother        deceased age 55    Social  History Social History  Substance Use Topics  . Smoking status: Never Smoker  . Smokeless tobacco: Current User    Types: Chew  . Alcohol use No     Allergies   Wheat bran; Carbamazepine; and Duloxetine   Review of Systems Review of Systems  Constitutional: Negative for appetite change and fatigue.  HENT: Negative for congestion, ear discharge and sinus pressure.   Eyes: Negative for discharge.  Respiratory: Negative for cough.   Cardiovascular: Negative for chest pain.  Gastrointestinal: Positive for abdominal pain and nausea. Negative for diarrhea.  Genitourinary: Negative for frequency and hematuria.  Musculoskeletal: Negative for back pain.    Skin: Negative for rash.  Neurological: Negative for seizures and headaches.  Psychiatric/Behavioral: Negative for hallucinations.     Physical Exam Updated Vital Signs BP (!) 118/93   Pulse 90   Temp 98.1 F (36.7 C)   Resp 18   Ht 5\' 10"  (1.778 m)   Wt 100.7 kg (222 lb)   SpO2 96%   BMI 31.85 kg/m   Physical Exam  Constitutional: He is oriented to person, place, and time. He appears well-developed.  HENT:  Head: Normocephalic.  Eyes: Conjunctivae and EOM are normal. No scleral icterus.  Neck: Neck supple. No thyromegaly present.  Cardiovascular: Normal rate and regular rhythm.  Exam reveals no gallop and no friction rub.   No murmur heard. Pulmonary/Chest: No stridor. He has no wheezes. He has no rales. He exhibits no tenderness.  Abdominal: He exhibits no distension. There is tenderness. There is no rebound.  Musculoskeletal: Normal range of motion. He exhibits no edema.  Lymphadenopathy:    He has no cervical adenopathy.  Neurological: He is oriented to person, place, and time. He exhibits normal muscle tone. Coordination normal.  Skin: No rash noted. No erythema.  Psychiatric: He has a normal mood and affect. His behavior is normal.     ED Treatments / Results  Labs (all labs ordered are listed, but only abnormal results are displayed) Labs Reviewed  COMPREHENSIVE METABOLIC PANEL - Abnormal; Notable for the following:       Result Value   Chloride 99 (*)    Glucose, Bld 104 (*)    Calcium 8.8 (*)    All other components within normal limits  URINALYSIS, ROUTINE W REFLEX MICROSCOPIC - Abnormal; Notable for the following:    APPearance HAZY (*)    Protein, ur 30 (*)    Squamous Epithelial / LPF 0-5 (*)    All other components within normal limits  LIPASE, BLOOD  CBC    EKG  EKG Interpretation None       Radiology Ct Abdomen Pelvis W Contrast  Result Date: 05/22/2017 CLINICAL DATA:  Abdomen pain a nausea since last night EXAM: CT ABDOMEN AND  PELVIS WITH CONTRAST TECHNIQUE: Multidetector CT imaging of the abdomen and pelvis was performed using the standard protocol following bolus administration of intravenous contrast. CONTRAST:  100mL ISOVUE-300 IOPAMIDOL (ISOVUE-300) INJECTION 61% COMPARISON:  December 22, 2012 FINDINGS: Lower chest: There is atelectasis of the posterior lung bases. There is no focal pneumonia. The heart size is upper limits of normal. Hepatobiliary: There is diffuse low density of the liver without vessel displacement. No focal liver lesion is identified. Patient status post prior cholecystectomy. Pancreas: Unremarkable. No pancreatic ductal dilatation or surrounding inflammatory changes. Spleen: Normal in size without focal abnormality. Adrenals/Urinary Tract: Adrenal glands are unremarkable. Kidneys are normal, without renal calculi, or hydronephrosis. There is a 2.5 cm simple cyst  in the lower pole right kidney. Bladder is unremarkable. Stomach/Bowel: There is a small hiatal hernia. Stomach is otherwise within normal limits. Patient status post prior appendectomy. No evidence of bowel wall thickening, or inflammatory changes. There is a mild focal dilated small bowel loop in the right mid abdomen. Vascular/Lymphatic: Aortic atherosclerosis. No enlarged abdominal or pelvic lymph nodes. Reproductive: Prostate is unremarkable. Other: There is peri umbilical herniation of mesenteric fat. There is no ascites. Musculoskeletal: Degenerative joint changes of the lower lumbar spine are noted. IMPRESSION: Focal dilated small bowel loop in the right mid abdomen. This could be due to focal ileus. No evidence of diverticulitis. Status post prior appendectomy and cholecystectomy. Electronically Signed   By: Sherian ReinWei-Chen  Lin M.D.   On: 05/22/2017 19:42   Dg Abdomen Acute W/chest  Result Date: 05/22/2017 CLINICAL DATA:  Center and lower Abd pain and nausea since last night. Reports history of 2 SBOs in the past (most recent in February of this  yr) and this feels similar. Pt reports some watery stool today. EXAM: DG ABDOMEN ACUTE W/ 1V CHEST COMPARISON:  12/26/2012 FINDINGS: Heart size is normal. There are no focal consolidations or pleural effusions. Bibasilar scarring appears stable. Biapical pleuroparenchymal changes are chronic. There is no free intraperitoneal air beneath the diaphragm. Supine and erect views of the abdomen show mildly prominent loops of small bowel in the right lower quadrant consistent with focal ileus or early obstruction. Visualized large bowel loops are not obstructed. Degenerative changes are seen in the spine. IMPRESSION: 1.  No evidence for acute cardiopulmonary abnormality. 2. Mildly dilated loops of small bowel in the right central abdomen consistent with focal ileus or early small bowel obstruction. Electronically Signed   By: Norva PavlovElizabeth  Brown M.D.   On: 05/22/2017 15:57    Procedures Procedures (including critical care time)  Medications Ordered in ED Medications  HYDROmorphone (DILAUDID) injection 1 mg (1 mg Intravenous Given 05/22/17 1901)  ondansetron (ZOFRAN) injection 4 mg (4 mg Intravenous Given 05/22/17 1901)  sodium chloride 0.9 % bolus 1,000 mL (1,000 mLs Intravenous New Bag/Given 05/22/17 1902)  iopamidol (ISOVUE-300) 61 % injection 100 mL (100 mLs Intravenous Contrast Given 05/22/17 1926)     Initial Impression / Assessment and Plan / ED Course  I have reviewed the triage vital signs and the nursing notes.  Pertinent labs & imaging results that were available during my care of the patient were reviewed by me and considered in my medical decision making (see chart for details).     Patient with a mild ileus. Patient feels better with pain medicine and nausea medicine. Patient does not want to be admitted to the hospital. He will be discharged with nausea medicine and pain medicine and told to take liquids only for the next 48 hours. He will return with worsening  discomfort or vomiting. Patient  will follow up with GI next week  Final Clinical Impressions(s) / ED Diagnoses   Final diagnoses:  Ileus (HCC)    New Prescriptions New Prescriptions   HYDROCODONE-ACETAMINOPHEN (NORCO/VICODIN) 5-325 MG TABLET    Take 1 tablet by mouth every 6 (six) hours as needed for moderate pain.   ONDANSETRON (ZOFRAN ODT) 4 MG DISINTEGRATING TABLET    4mg  ODT q4 hours prn nausea/vomit     Bethann BerkshireZammit, Carmalita Wakefield, MD 05/22/17 2001

## 2017-05-22 NOTE — Discharge Instructions (Signed)
Follow-up with Dr. Darrick Pennafields next week. Take liquids only for the next 2 days. Return to the emergency department if pain gets worse or vomiting

## 2017-05-22 NOTE — ED Triage Notes (Signed)
Abd pain and nausea since last night. Reports history of 2 SBOs in the past and this feels similar. Pt reports some watery stool today.

## 2017-05-28 ENCOUNTER — Emergency Department (HOSPITAL_COMMUNITY): Payer: Medicare Other

## 2017-05-28 ENCOUNTER — Encounter (HOSPITAL_COMMUNITY): Payer: Self-pay | Admitting: Emergency Medicine

## 2017-05-28 ENCOUNTER — Inpatient Hospital Stay (HOSPITAL_COMMUNITY)
Admission: EM | Admit: 2017-05-28 | Discharge: 2017-06-05 | DRG: 390 | Disposition: A | Discharge status: Home / Self Care | Payer: Medicare Other | Attending: Internal Medicine | Admitting: Internal Medicine

## 2017-05-28 DIAGNOSIS — R1110 Vomiting, unspecified: Secondary | ICD-10-CM

## 2017-05-28 DIAGNOSIS — K565 Intestinal adhesions [bands], unspecified as to partial versus complete obstruction: Principal | ICD-10-CM | POA: Diagnosis present

## 2017-05-28 DIAGNOSIS — F17220 Nicotine dependence, chewing tobacco, uncomplicated: Secondary | ICD-10-CM | POA: Diagnosis present

## 2017-05-28 DIAGNOSIS — Z4659 Encounter for fitting and adjustment of other gastrointestinal appliance and device: Secondary | ICD-10-CM

## 2017-05-28 DIAGNOSIS — K219 Gastro-esophageal reflux disease without esophagitis: Secondary | ICD-10-CM | POA: Diagnosis present

## 2017-05-28 DIAGNOSIS — K56600 Partial intestinal obstruction, unspecified as to cause: Secondary | ICD-10-CM

## 2017-05-28 DIAGNOSIS — Z23 Encounter for immunization: Secondary | ICD-10-CM

## 2017-05-28 DIAGNOSIS — K56609 Unspecified intestinal obstruction, unspecified as to partial versus complete obstruction: Secondary | ICD-10-CM | POA: Diagnosis present

## 2017-05-28 DIAGNOSIS — Z9049 Acquired absence of other specified parts of digestive tract: Secondary | ICD-10-CM

## 2017-05-28 DIAGNOSIS — Z91018 Allergy to other foods: Secondary | ICD-10-CM

## 2017-05-28 DIAGNOSIS — I341 Nonrheumatic mitral (valve) prolapse: Secondary | ICD-10-CM | POA: Diagnosis present

## 2017-05-28 DIAGNOSIS — Z800 Family history of malignant neoplasm of digestive organs: Secondary | ICD-10-CM

## 2017-05-28 DIAGNOSIS — R109 Unspecified abdominal pain: Secondary | ICD-10-CM | POA: Diagnosis not present

## 2017-05-28 DIAGNOSIS — M545 Low back pain: Secondary | ICD-10-CM | POA: Diagnosis present

## 2017-05-28 DIAGNOSIS — K429 Umbilical hernia without obstruction or gangrene: Secondary | ICD-10-CM | POA: Diagnosis present

## 2017-05-28 DIAGNOSIS — K5652 Intestinal adhesions [bands] with complete obstruction: Secondary | ICD-10-CM

## 2017-05-28 DIAGNOSIS — G8929 Other chronic pain: Secondary | ICD-10-CM | POA: Diagnosis present

## 2017-05-28 DIAGNOSIS — Z888 Allergy status to other drugs, medicaments and biological substances status: Secondary | ICD-10-CM

## 2017-05-28 DIAGNOSIS — Z8719 Personal history of other diseases of the digestive system: Secondary | ICD-10-CM

## 2017-05-28 DIAGNOSIS — R739 Hyperglycemia, unspecified: Secondary | ICD-10-CM | POA: Diagnosis present

## 2017-05-28 LAB — COMPREHENSIVE METABOLIC PANEL
ALT: 33 U/L (ref 17–63)
AST: 27 U/L (ref 15–41)
Albumin: 4 g/dL (ref 3.5–5.0)
Alkaline Phosphatase: 80 U/L (ref 38–126)
Anion gap: 10 (ref 5–15)
BUN: 7 mg/dL (ref 6–20)
CO2: 24 mmol/L (ref 22–32)
Calcium: 9.1 mg/dL (ref 8.9–10.3)
Chloride: 101 mmol/L (ref 101–111)
Creatinine, Ser: 0.99 mg/dL (ref 0.61–1.24)
GFR calc Af Amer: >60 mL/min (ref >60)
GFR calc non Af Amer: >60 mL/min (ref >60)
Glucose, Bld: 164 mg/dL — ABNORMAL HIGH (ref 65–99)
Potassium: 3.8 mmol/L (ref 3.5–5.1)
Sodium: 135 mmol/L (ref 135–145)
Total Bilirubin: 0.9 mg/dL (ref 0.3–1.2)
Total Protein: 7.7 g/dL (ref 6.5–8.1)

## 2017-05-28 LAB — CBC
HCT: 46.9 % (ref 39.0–52.0)
Hemoglobin: 16.6 g/dL (ref 13.0–17.0)
MCH: 32.5 pg (ref 26.0–34.0)
MCHC: 35.4 g/dL (ref 30.0–36.0)
MCV: 92 fL (ref 78.0–100.0)
Platelets: 232 10*3/uL (ref 150–400)
RBC: 5.1 MIL/uL (ref 4.22–5.81)
RDW: 13.5 % (ref 11.5–15.5)
WBC: 8.3 10*3/uL (ref 4.0–10.5)

## 2017-05-28 LAB — GLUCOSE, CAPILLARY: GLUCOSE-CAPILLARY: 91 mg/dL (ref 65–99)

## 2017-05-28 LAB — LIPASE, BLOOD: Lipase: 25 U/L (ref 11–51)

## 2017-05-28 MED ORDER — ONDANSETRON HCL 4 MG/2ML IJ SOLN
4.0000 mg | Freq: Once | INTRAMUSCULAR | Status: AC
  Administered 2017-05-28: 4 mg via INTRAVENOUS
  Filled 2017-05-28: qty 2

## 2017-05-28 MED ORDER — MORPHINE SULFATE (PF) 2 MG/ML IV SOLN
2.0000 mg | INTRAVENOUS | Status: DC | PRN
  Administered 2017-05-28 – 2017-06-02 (×18): 2 mg via INTRAVENOUS
  Filled 2017-05-28 (×18): qty 1

## 2017-05-28 MED ORDER — ACETAMINOPHEN 325 MG PO TABS: 650.0000 mg | ORAL_TABLET | Freq: Four times a day (QID) | ORAL | Status: DC | PRN

## 2017-05-28 MED ORDER — INSULIN ASPART 100 UNIT/ML ~~LOC~~ SOLN
0.0000 [IU] | SUBCUTANEOUS | Status: DC
  Administered 2017-06-01 – 2017-06-03 (×4): 1 [IU] via SUBCUTANEOUS

## 2017-05-28 MED ORDER — HYDROCODONE-ACETAMINOPHEN 5-325 MG PO TABS
1.0000 | ORAL_TABLET | Freq: Four times a day (QID) | ORAL | Status: DC | PRN
  Administered 2017-06-01: 1 via ORAL
  Filled 2017-05-28: qty 1

## 2017-05-28 MED ORDER — PREGABALIN 75 MG PO CAPS
150.0000 mg | ORAL_CAPSULE | Freq: Three times a day (TID) | ORAL | Status: DC
  Administered 2017-05-28 – 2017-06-05 (×23): 150 mg via ORAL
  Filled 2017-05-28 (×23): qty 2

## 2017-05-28 MED ORDER — ACETAMINOPHEN 650 MG RE SUPP: 650.0000 mg | Freq: Four times a day (QID) | RECTAL | Status: DC | PRN

## 2017-05-28 MED ORDER — PNEUMOCOCCAL VAC POLYVALENT 25 MCG/0.5ML IJ INJ
0.5000 mL | INJECTION | INTRAMUSCULAR | Status: AC
  Administered 2017-05-29: 0.5 mL via INTRAMUSCULAR
  Filled 2017-05-28: qty 0.5

## 2017-05-28 MED ORDER — ONDANSETRON HCL 4 MG PO TABS: 4.0000 mg | ORAL_TABLET | Freq: Four times a day (QID) | ORAL | Status: DC | PRN

## 2017-05-28 MED ORDER — MORPHINE SULFATE (PF) 4 MG/ML IV SOLN
4.0000 mg | Freq: Once | INTRAVENOUS | Status: AC
  Administered 2017-05-28: 4 mg via INTRAVENOUS
  Filled 2017-05-28: qty 1

## 2017-05-28 MED ORDER — HYDROMORPHONE HCL 1 MG/ML IJ SOLN
1.0000 mg | Freq: Once | INTRAMUSCULAR | Status: AC
  Administered 2017-05-28: 1 mg via INTRAVENOUS
  Filled 2017-05-28: qty 1

## 2017-05-28 MED ORDER — PANTOPRAZOLE SODIUM 40 MG PO TBEC
40.0000 mg | DELAYED_RELEASE_TABLET | Freq: Every day | ORAL | Status: DC
  Administered 2017-05-28 – 2017-06-05 (×9): 40 mg via ORAL
  Filled 2017-05-28 (×9): qty 1

## 2017-05-28 MED ORDER — SODIUM CHLORIDE 0.9 % IV SOLN
INTRAVENOUS | Status: DC
  Administered 2017-05-28 – 2017-06-01 (×10): via INTRAVENOUS

## 2017-05-28 MED ORDER — ONDANSETRON HCL 4 MG/2ML IJ SOLN
4.0000 mg | Freq: Four times a day (QID) | INTRAMUSCULAR | Status: DC | PRN
  Administered 2017-05-29 – 2017-06-03 (×13): 4 mg via INTRAVENOUS
  Filled 2017-05-28 (×15): qty 2

## 2017-05-28 NOTE — ED Provider Notes (Signed)
[facility]-EMER[facility]ENCY DEPT Provider Note   CSN: [id number] Arrival date & time: 05/28/17  1327  By signing my name below, I, Gary Marsh, attest that this documentation has been prepared under the direction and in the presence of physician practitioner, Raeford RazorKohut, Arshia Rondon, MD. Electronically Signed: Linna Darnerussell Marsh, Scribe. 05/28/2017. 2:17 PM.  History   Chief Complaint Chief Complaint  Patient presents with  . Abdominal Pain   The history is provided by the patient. No language interpreter was used.    HPI Comments: Gary Marsh is a 66 y.o. male with PMHx including GERD who presents to the Emergency Department complaining of persistent abdominal pain and distention for one week. The pain is most significant around the umbilicus. He reports some associated nausea without vomiting. He has a history of bowel obstructions that were treated without surgical intervention and states his current symptoms are similar. There are no alleviating factors noted. The pain is worse with palpation. He notes that he had a normal bowel movement early this morning. Patient was evaluated here on 7/26 at the onset of his symptoms and his CT revealed a likely focal ileus in the right mid abdomen. He was advised to follow-up with GI but has not. Patient has a pertinent PSHx of appendectomy, cholecystectomy, hernia repair, and EGD. He denies fevers, chills, dysuria, urinary retention, or any other associated symptoms. He is in town temporarily to visit his family.  Past Medical History:  Diagnosis Date  . Chronic back pain   . DDD (degenerative disc disease)   . GERD (gastroesophageal reflux disease)   . MVP (mitral valve prolapse)   . S/P colonoscopy April 2010   left-sided diverticula, small rectal polyp  . S/P endoscopy April 2010   small antral erosion  . Urinary hesitancy    Hx    Patient Active Problem List   Diagnosis Date Noted  . Hemorrhoid 03/03/2011  . LLQ abdominal pain 03/03/2011  . CHEST PAIN  UNSPECIFIED 10/16/2010  . MELENA 04/05/2009  . DIARRHEA 04/05/2009  . ABDOMINAL PAIN OTHER SPECIFIED SITE 04/05/2009  . GERD 02/03/2009  . DEGENERATIVE DISC DISEASE 02/03/2009  . BACK PAIN, CHRONIC 02/03/2009  . NAUSEA WITH VOMITING 02/03/2009  . URINARY HESITANCY 02/03/2009  . ABDOMINAL PAIN 02/03/2009  . MITRAL VALVE PROLAPSE, HX OF 02/03/2009    Past Surgical History:  Procedure Laterality Date  . APPENDECTOMY    . back surgery X 5    . CHOLECYSTECTOMY    . COLONOSCOPY  02/20/2009   WUJ:[id number]:Rectal polyp status post hot snare removal/ scattered left-sided diverticula  . ESOPHAGOGASTRODUODENOSCOPY  02/20/2009   RMR: Normal esophagus.  A small antral ulceration status post biopsy   . HERNIA REPAIR    . left inguinal hernia repair         Home Medications    Prior to Admission medications   Medication Sig Start Date End Date Taking? Authorizing Provider  atenolol (TENORMIN) 25 MG tablet Take 25 mg by mouth daily.      [provider]  diphenhydrAMINE (BENADRYL) 25 mg capsule Take 25 mg by mouth every 6 (six) hours as needed for itching.    [provider]  HYDROcodone-acetaminophen (NORCO/VICODIN) 5-325 MG tablet Take 1 tablet by mouth every 6 (six) hours as needed for moderate pain. 05/22/17   Bethann BerkshireZammit, Joseph, MD  ondansetron (ZOFRAN ODT) 4 MG disintegrating tablet 4mg  ODT q4 hours prn nausea/vomit 05/22/17   Bethann BerkshireZammit, Joseph, MD  ondansetron (ZOFRAN) 4 MG tablet Take 4 mg by mouth every 8 (  eight) hours as needed for nausea or vomiting.    [provider]  pregabalin (LYRICA) 150 MG capsule Take 150 mg by mouth 3 (three) times daily.     [provider]  UNKNOWN TO PATIENT Take 1 tablet by mouth daily as needed. Patient states it is a fluid pill    [provider]    Family History Family History  Problem Relation Age of Onset  . Colon cancer Mother        deceased age 64    Social History Social History  Substance Use Topics  .  Smoking status: Never Smoker  . Smokeless tobacco: Current User    Types: Chew  . Alcohol use No     Allergies   Wheat bran; Carbamazepine; and Duloxetine   Review of Systems Review of Systems  All other systems reviewed and are negative.  Physical Exam Updated Vital Signs BP 137/66 (BP Location: Right Arm)   Pulse (!) 131   Temp 98.7 F (37.1 C) (Oral)   Resp 18   Ht 5\' 10"  (1.778 m)   Wt 222 lb (100.7 kg)   SpO2 96%   BMI 31.85 kg/m   Physical Exam  Constitutional: He appears well-developed and well-nourished.  HENT:  Head: Normocephalic.  Right Ear: External ear normal.  Left Ear: External ear normal.  Nose: Nose normal.  Eyes: Conjunctivae are normal. Right eye exhibits no discharge. Left eye exhibits no discharge.  Neck: Normal range of motion.  Cardiovascular: Normal rate, regular rhythm and normal heart sounds.   No murmur heard. Pulmonary/Chest: Effort normal and breath sounds normal. No respiratory distress. He has no wheezes. He has no rales.  Abdominal: Soft. He exhibits distension. There is tenderness. There is no rebound and no guarding. No hernia.  Distended, soft. Diffuse tenderness worse around the umbilicus. Well-healed surgical scar. No hernia. Decreased bowel sounds.  Musculoskeletal: Normal range of motion. He exhibits no edema or tenderness.  Neurological: He is alert. No cranial nerve deficit. Coordination normal.  Skin: Skin is warm and dry. No rash noted. No erythema. No pallor.  Psychiatric: He has a normal mood and affect. His behavior is normal.  Nursing note and vitals reviewed.  ED Treatments / Results  Labs (all labs ordered are listed, but only abnormal results are displayed) Labs Reviewed  COMPREHENSIVE METABOLIC PANEL - Abnormal; Notable for the following:       Result Value   Glucose, Bld 164 (*)    All other components within normal limits  LIPASE, BLOOD  CBC  URINALYSIS, ROUTINE W REFLEX MICROSCOPIC    EKG  EKG  Interpretation None       Radiology Dg Chest Port 1 View  Result Date: 05/28/2017 CLINICAL DATA:  History small bowel obstruction. Recurrent nausea and vomiting beginning yesterday. The patient reports abdominal pain and distension for the past week. The patient has undergone nasogastric tube placement. EXAM: PORTABLE CHEST 1 VIEW COMPARISON:  Chest x-ray of May 22, 2017 FINDINGS: The esophagogastric tube tip lies in the gastric cardia and the proximal port just above the GE junction. The lungs are hypoinflated but grossly clear. The cardiac silhouette is mildly enlarged. The central pulmonary vascularity is prominent. IMPRESSION: Advancement of the nasogastric tube by approximately 10 cm is recommended to assure that the proximal port as well as the tip remain below the GE junction. Mild cardiomegaly.  No acute cardiopulmonary abnormality. Electronically Signed   By: David  SwazilandJordan M.D.   On: 05/28/2017 15:11  Dg Abd Portable 1v  Result Date: 05/28/2017 CLINICAL DATA:  Nausea, vomiting. History of small bowel obstruction. EXAM: PORTABLE ABDOMEN - 1 VIEW COMPARISON:  05/22/2017 CT FINDINGS: NG tube tip is in the proximal stomach with the side port in the distal esophagus. Relative paucity of gas in the abdomen with gas in a mildly prominent central small bowel loop concerning for continued small bowel obstruction. No organomegaly or free air. No suspicious calcification. IMPRESSION: NG tube tip in the proximal stomach with the side port in the distal esophagus. Mildly prominent central abdominal small bowel loop concerning for continued small bowel obstruction. Electronically Signed   By: Charlett Nose M.D.   On: 05/28/2017 15:11    Procedures Procedures (including critical care time)  DIAGNOSTIC STUDIES: Oxygen Saturation is 96% on RA, adequate by my interpretation.    COORDINATION OF CARE: 2:09 PM Discussed treatment plan with pt at bedside and pt agreed to plan.  Medications Ordered in  ED Medications  0.9 %  sodium chloride infusion ( Intravenous New Bag/Given 05/28/17 1419)  ondansetron (ZOFRAN) injection 4 mg (4 mg Intravenous Given 05/28/17 1419)  HYDROmorphone (DILAUDID) injection 1 mg (1 mg Intravenous Given 05/28/17 1419)     Initial Impression / Assessment and Plan / ED Course  I have reviewed the triage vital signs and the nursing notes.  Pertinent labs & imaging results that were available during my care of the patient were reviewed by me and considered in my medical decision making (see chart for details).     66 year old male with abdominal pain and nausea/vomiting. Clinically small bowel obstruction. Plain films suggestive. NG tube place. Admit.   Final Clinical Impressions(s) / ED Diagnoses   Final diagnoses:  SBO (small bowel obstruction) (HCC)    New Prescriptions New Prescriptions   No medications on file   I personally preformed the services scribed in my presence. The recorded information has been reviewed is accurate. Raeford Razor, MD.    Raeford Razor, MD 06/10/17 [phone]

## 2017-05-28 NOTE — ED Triage Notes (Signed)
Pt reports is from out of town and is visiting family. Pt reports seen for same last Wednesday and was told had "small blockage" in intestine. Pt reports last BM last night. Pt denies v/d/fever.

## 2017-05-28 NOTE — H&P (Signed)
History and Physical    Ahmeer Tuman ZHY:[id number] DOB: 01-May-1951 DOA: 05/28/2017  PCP: Frederik Pear, [place] Consultants:  None Patient coming from: travels a lot, occasionally lives with daughter here in [place]; [place]: daughter, [phone]  Chief Complaint: abdominal pain  HPI: Gary Marsh is a 66 y.o. male with medical history significant of GERD and back pain with several episodes of SBO presenting with abdominal pain, n/v.  Symptoms started last week.  He was seen on Wednesday (7/26) in the ER and diagnosed with mild ileus; he preferred to not be admitted and was discharged with pain and nausea medication and told to take liquids for 48 hours and f/u next week with GI.  He thought it was getting better, has been on liquids and jello since.  Last night about 2am it happened again.  This is the 4th recent episode, between here and [place].  He had a hernia surgery 2 years ago in [place] and has had episodic SBOs since.  Has not noticed blood in his vomit.  1 episode of emesis this AM, "mostly dry heaves; it was bad".  Had a regular BM about 2AM, the first one he has had in a week.   ED Course: SBO - plain films suggestive (CT scanner is down).  NG tube placed.  Review of Systems: As per HPI; otherwise review of systems reviewed and negative.   Ambulatory Status:  Ambulates without assistance  Past Medical History:  Diagnosis Date  . Chronic back pain   . DDD (degenerative disc disease)   . GERD (gastroesophageal reflux disease)   . MVP (mitral valve prolapse)   . S/P colonoscopy April 2010   left-sided diverticula, small rectal polyp  . S/P endoscopy April 2010   small antral erosion  . Urinary hesitancy    Hx    Past Surgical History:  Procedure Laterality Date  . APPENDECTOMY    . back surgery X 7    . CHOLECYSTECTOMY    . COLONOSCOPY  02/20/2009   MWN:UUVOZD polyp status post hot snare removal/ scattered left-sided diverticula  .  ESOPHAGOGASTRODUODENOSCOPY  02/20/2009   RMR: Normal esophagus.  A small antral ulceration status post biopsy   . HERNIA REPAIR    . left inguinal hernia repair      Social History   Social History  . Marital status: Widowed    Spouse name: N/A  . Number of children: N/A  . Years of education: N/A   Occupational History  . works part-time at a funeral home in [employer]    Social History Main Topics  . Smoking status: Never Smoker  . Smokeless tobacco: Current User    Types: Chew  . Alcohol use No  . Drug use: No  . Sexual activity: Not on file   Other Topics Concern  . Not on file   Social History Narrative  . No narrative on file    Allergies  Allergen Reactions  . Wheat Bran Nausea Only  . Carbamazepine Itching and Rash  . Duloxetine Itching and Rash    Family History  Problem Relation Age of Onset  . Colon cancer Mother        deceased age 64    Prior to Admission medications   Medication Sig Start Date End Date Taking? Authorizing Provider  atenolol (TENORMIN) 25 MG tablet Take 25 mg by mouth as needed. Takes when needed, slows his heart rate down   Yes [provider]  furosemide (LASIX) 40 MG tablet  Take 40 mg by mouth daily.   Yes [provider]  HYDROcodone-acetaminophen (NORCO/VICODIN) 5-325 MG tablet Take 1 tablet by mouth every 6 (six) hours as needed for moderate pain. 05/22/17  Yes Bethann BerkshireZammit, Joseph, MD  ondansetron (ZOFRAN ODT) 4 MG disintegrating tablet 4mg  ODT q4 hours prn nausea/vomit 05/22/17  Yes Bethann BerkshireZammit, Joseph, MD  pantoprazole (PROTONIX) 40 MG tablet Take 40 mg by mouth daily.   Yes [provider]  pregabalin (LYRICA) 150 MG capsule Take 150 mg by mouth 3 (three) times daily.    Yes [provider]    Physical Exam: Vitals:   05/28/17 1600 05/28/17 1816 05/28/17 1848 05/28/17 2139  BP: 120/80 (!) 125/101 112/79 110/66  Pulse: 100 (!) 106 (!) 102 88  Resp:   18 20  Temp:   98.5 F (36.9 C) 98.3 F (36.8 C)    TempSrc:   Oral Oral  SpO2: 93% 93% 96% 95%  Weight:   98.5 kg (217 lb 2.5 oz)   Height:   5\' 10"  (1.778 m)      General: Appears calm and comfortable and is NAD; NG tube in place and is draining frothy pink liquid Eyes:  PERRL, EOMI, normal lids, iris ENT:  grossly normal hearing, lips & tongue, mmm Neck:  no LAD, masses or thyromegaly Cardiovascular:  RRR, no m/r/g. No LE edema.  Respiratory:  CTA bilaterally, no w/r/r. Normal respiratory effort. Abdomen:  soft, mild TTP in LLQ, nd, NABS Skin:  no rash or induration seen on limited exam Musculoskeletal:  grossly normal tone BUE/BLE, good ROM, no bony abnormality Psychiatric:  grossly normal mood and affect, speech fluent and appropriate, AOx3 Neurologic:  CN 2-12 grossly intact, moves all extremities in coordinated fashion, sensation intact  Labs on Admission: I have personally reviewed following labs and imaging studies  CBC:  Recent Labs Lab 05/22/17 1529 05/28/17 1351  WBC 10.3 8.3  HGB 16.6 16.6  HCT 47.2 46.9  MCV 92.5 92.0  PLT 214 232   Basic Metabolic Panel:  Recent Labs Lab 05/22/17 1529 05/28/17 1351  NA 136 135  K 3.9 3.8  CL 99* 101  CO2 29 24  GLUCOSE 104* 164*  BUN 10 7  CREATININE 1.16 0.99  CALCIUM 8.8* 9.1   GFR: Estimated Creatinine Clearance: 88.7 mL/min (by C-G formula based on SCr of 0.99 mg/dL). Liver Function Tests:  Recent Labs Lab 05/22/17 1529 05/28/17 1351  AST 23 27  ALT 28 33  ALKPHOS 65 80  BILITOT 1.0 0.9  PROT 7.6 7.7  ALBUMIN 4.1 4.0    Recent Labs Lab 05/22/17 1529 05/28/17 1351  LIPASE 23 25   No results for input(s): AMMONIA in the last 168 hours. Coagulation Profile: No results for input(s): INR, PROTIME in the last 168 hours. Cardiac Enzymes: No results for input(s): CKTOTAL, CKMB, CKMBINDEX, TROPONINI in the last 168 hours. BNP (last 3 results) No results for input(s): PROBNP in the last 8760 hours. HbA1C: No results for input(s): HGBA1C in the  last 72 hours. CBG: No results for input(s): GLUCAP in the last 168 hours. Lipid Profile: No results for input(s): CHOL, HDL, LDLCALC, TRIG, CHOLHDL, LDLDIRECT in the last 72 hours. Thyroid Function Tests: No results for input(s): TSH, T4TOTAL, FREET4, T3FREE, THYROIDAB in the last 72 hours. Anemia Panel: No results for input(s): VITAMINB12, FOLATE, FERRITIN, TIBC, IRON, RETICCTPCT in the last 72 hours. Urine analysis:    Component Value Date/Time   COLORURINE YELLOW 05/22/2017 1512   APPEARANCEUR HAZY (  A) 05/22/2017 1512   LABSPEC 1.023 05/22/2017 1512   PHURINE 6.0 05/22/2017 1512   GLUCOSEU NEGATIVE 05/22/2017 1512   HGBUR NEGATIVE 05/22/2017 1512   BILIRUBINUR NEGATIVE 05/22/2017 1512   KETONESUR NEGATIVE 05/22/2017 1512   PROTEINUR 30 (A) 05/22/2017 1512   UROBILINOGEN 0.2 03/30/2010 1030   NITRITE NEGATIVE 05/22/2017 1512   LEUKOCYTESUR NEGATIVE 05/22/2017 1512    Creatinine Clearance: Estimated Creatinine Clearance: 88.7 mL/min (by C-G formula based on SCr of 0.99 mg/dL).  Sepsis Labs: @LABRCNTIP (procalcitonin:4,lacticidven:4) )No results found for this or any previous visit (from the past 240 hour(s)).   Radiological Exams on Admission: Dg Chest Port 1 View  Result Date: 05/28/2017 CLINICAL DATA:  History small bowel obstruction. Recurrent nausea and vomiting beginning yesterday. The patient reports abdominal pain and distension for the past week. The patient has undergone nasogastric tube placement. EXAM: PORTABLE CHEST 1 VIEW COMPARISON:  Chest x-ray of May 22, 2017 FINDINGS: The esophagogastric tube tip lies in the gastric cardia and the proximal port just above the GE junction. The lungs are hypoinflated but grossly clear. The cardiac silhouette is mildly enlarged. The central pulmonary vascularity is prominent. IMPRESSION: Advancement of the nasogastric tube by approximately 10 cm is recommended to assure that the proximal port as well as the tip remain below the GE  junction. Mild cardiomegaly.  No acute cardiopulmonary abnormality. Electronically Signed   By: David  SwazilandJordan M.D.   On: 05/28/2017 15:11   Dg Abd Portable 1v  Result Date: 05/28/2017 CLINICAL DATA:  Nausea, vomiting. History of small bowel obstruction. EXAM: PORTABLE ABDOMEN - 1 VIEW COMPARISON:  05/22/2017 CT FINDINGS: NG tube tip is in the proximal stomach with the side port in the distal esophagus. Relative paucity of gas in the abdomen with gas in a mildly prominent central small bowel loop concerning for continued small bowel obstruction. No organomegaly or free air. No suspicious calcification. IMPRESSION: NG tube tip in the proximal stomach with the side port in the distal esophagus. Mildly prominent central abdominal small bowel loop concerning for continued small bowel obstruction. Electronically Signed   By: Charlett NoseKevin  Dover M.D.   On: 05/28/2017 15:11    EKG: not done  Assessment/Plan Principal Problem:   SBO (small bowel obstruction) (HCC) Active Problems:   Hyperglycemia   SBO -Patient with prior hernia surgery and subsequent recurrent SBO presenting with subacute onset of abdominal pain with n/v, abdominal distention, and xray findings c/w SBO -Will place in observation status on Med Surg -Gen Surg consulted to see in AM; currently no indication for surgical intervention -NPO for bowel rest -NG tube in place -IVF hydration -Pain control with morphine -Already with some bowel sounds so hopefully will have quick recovery; however, this has been going on for several days now and so he may not recover as well -Of note, the fluid coming up from his NG tube is pink and frothy and appears to have blood tinge; this may be from aggressive NG tube placement but we will need to monitor this -Consider lavage if ongoing  Hyperglycemia -Glucose 164 -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 The aforementioned taken together are felt to place the patient  at high risk for further clinical deterioration. However it is anticipated that the patient may be medically stable for discharge from the hospital within 24 to 48 hours.    Jonah Blue MD [facility]  If 7PM-7AM, please contact night-coverage [email or web address] Password [facility]  05/28/2017, 9:47 PM

## 2017-05-29 DIAGNOSIS — Z23 Encounter for immunization: Secondary | ICD-10-CM | POA: Diagnosis not present

## 2017-05-29 DIAGNOSIS — Z9049 Acquired absence of other specified parts of digestive tract: Secondary | ICD-10-CM | POA: Diagnosis not present

## 2017-05-29 DIAGNOSIS — R1033 Periumbilical pain: Secondary | ICD-10-CM | POA: Diagnosis not present

## 2017-05-29 DIAGNOSIS — K565 Intestinal adhesions [bands], unspecified as to partial versus complete obstruction: Secondary | ICD-10-CM | POA: Diagnosis present

## 2017-05-29 DIAGNOSIS — Z91018 Allergy to other foods: Secondary | ICD-10-CM | POA: Diagnosis not present

## 2017-05-29 DIAGNOSIS — Z888 Allergy status to other drugs, medicaments and biological substances status: Secondary | ICD-10-CM | POA: Diagnosis not present

## 2017-05-29 DIAGNOSIS — R739 Hyperglycemia, unspecified: Secondary | ICD-10-CM | POA: Diagnosis not present

## 2017-05-29 DIAGNOSIS — K5669 Other partial intestinal obstruction: Secondary | ICD-10-CM

## 2017-05-29 DIAGNOSIS — Z800 Family history of malignant neoplasm of digestive organs: Secondary | ICD-10-CM | POA: Diagnosis not present

## 2017-05-29 DIAGNOSIS — K56609 Unspecified intestinal obstruction, unspecified as to partial versus complete obstruction: Secondary | ICD-10-CM | POA: Diagnosis not present

## 2017-05-29 DIAGNOSIS — K429 Umbilical hernia without obstruction or gangrene: Secondary | ICD-10-CM | POA: Diagnosis present

## 2017-05-29 DIAGNOSIS — M545 Low back pain: Secondary | ICD-10-CM | POA: Diagnosis present

## 2017-05-29 DIAGNOSIS — I341 Nonrheumatic mitral (valve) prolapse: Secondary | ICD-10-CM | POA: Diagnosis present

## 2017-05-29 DIAGNOSIS — F17220 Nicotine dependence, chewing tobacco, uncomplicated: Secondary | ICD-10-CM | POA: Diagnosis present

## 2017-05-29 DIAGNOSIS — Z8719 Personal history of other diseases of the digestive system: Secondary | ICD-10-CM | POA: Diagnosis not present

## 2017-05-29 DIAGNOSIS — K219 Gastro-esophageal reflux disease without esophagitis: Secondary | ICD-10-CM | POA: Diagnosis present

## 2017-05-29 DIAGNOSIS — G8929 Other chronic pain: Secondary | ICD-10-CM | POA: Diagnosis present

## 2017-05-29 DIAGNOSIS — R109 Unspecified abdominal pain: Secondary | ICD-10-CM | POA: Diagnosis present

## 2017-05-29 LAB — GLUCOSE, CAPILLARY
GLUCOSE-CAPILLARY: 99 mg/dL (ref 65–99)
GLUCOSE-CAPILLARY: 106 mg/dL — AB (ref 65–99)
GLUCOSE-CAPILLARY: 84 mg/dL (ref 65–99)
Glucose-Capillary: 100 mg/dL — ABNORMAL HIGH (ref 65–99)
Glucose-Capillary: 91 mg/dL (ref 65–99)
Glucose-Capillary: 84 mg/dL (ref 65–99)

## 2017-05-29 LAB — CBC
HEMATOCRIT: 44.6 % (ref 39.0–52.0)
Hemoglobin: 15.3 g/dL (ref 13.0–17.0)
MCH: 31.8 pg (ref 26.0–34.0)
MCHC: 34.3 g/dL (ref 30.0–36.0)
MCV: 92.7 fL (ref 78.0–100.0)
Platelets: 215 10*3/uL (ref 150–400)
RBC: 4.81 MIL/uL (ref 4.22–5.81)
RDW: 13.7 % (ref 11.5–15.5)
WBC: 8.1 10*3/uL (ref 4.0–10.5)

## 2017-05-29 LAB — BASIC METABOLIC PANEL
ANION GAP: 9 (ref 5–15)
BUN: 10 mg/dL (ref 6–20)
CO2: 25 mmol/L (ref 22–32)
Calcium: 8.4 mg/dL — ABNORMAL LOW (ref 8.9–10.3)
Chloride: 104 mmol/L (ref 101–111)
Creatinine, Ser: 0.93 mg/dL (ref 0.61–1.24)
Glucose, Bld: 109 mg/dL — ABNORMAL HIGH (ref 65–99)
POTASSIUM: 3.5 mmol/L (ref 3.5–5.1)
SODIUM: 138 mmol/L (ref 135–145)

## 2017-05-29 NOTE — Consult Note (Signed)
Reason for Consult: Small bowel obstruction Referring Physician: Dr. Marcelino Freestone is an 66 y.o. male.  HPI: Patient is a 66 year old white male who presented to [facility] with periumbilical pain. A KUB was performed which revealed a single loop of dilated bowel. He had a previous CT scan which also revealed a single dilated loop of bowel. He states that his last bowel movement was yesterday and was normal. He also has been passing flatus. He does have mild nausea, but denies any significant emesis. He is visiting from [place] and states he had a previous episode of this earlier this year. It did resolve with NG tube decompression. He denies any fever or chills.  Past Medical History:  Diagnosis Date  . Chronic back pain   . DDD (degenerative disc disease)   . GERD (gastroesophageal reflux disease)   . MVP (mitral valve prolapse)   . S/P colonoscopy April 2010   left-sided diverticula, small rectal polyp  . S/P endoscopy April 2010   small antral erosion  . Urinary hesitancy    Hx    Past Surgical History:  Procedure Laterality Date  . APPENDECTOMY    . back surgery X 7    . CHOLECYSTECTOMY    . COLONOSCOPY  02/20/2009   YBW:LSLHTD polyp status post hot snare removal/ scattered left-sided diverticula  . ESOPHAGOGASTRODUODENOSCOPY  02/20/2009   RMR: Normal esophagus.  A small antral ulceration status post biopsy   . HERNIA REPAIR    . left inguinal hernia repair      Family History  Problem Relation Age of Onset  . Colon cancer Mother        deceased age 53    Social History:  reports that he has never smoked. His smokeless tobacco use includes Chew. He reports that he does not drink alcohol or use drugs.  Allergies:  Allergies  Allergen Reactions  . Wheat Bran Nausea Only  . Carbamazepine Itching and Rash  . Duloxetine Itching and Rash    Medications:  Scheduled: . insulin aspart  0-9 Units Subcutaneous Q4H  . pantoprazole  40 mg Oral Daily  .  pneumococcal 23 valent vaccine  0.5 mL Intramuscular Tomorrow-1000  . pregabalin  150 mg Oral TID    Results for orders placed or performed during the hospital encounter of 05/28/17 (from the past 48 hour(s))  Lipase, blood     Status: None   Collection Time: 05/28/17  1:51 PM  Result Value Ref Range   Lipase 25 11 - 51 U/L  Comprehensive metabolic panel     Status: Abnormal   Collection Time: 05/28/17  1:51 PM  Result Value Ref Range   Sodium 135 135 - 145 mmol/L   Potassium 3.8 3.5 - 5.1 mmol/L   Chloride 101 101 - 111 mmol/L   CO2 24 22 - 32 mmol/L   Glucose, Bld 164 (H) 65 - 99 mg/dL   BUN 7 6 - 20 mg/dL   Creatinine, Ser 0.99 0.61 - 1.24 mg/dL   Calcium 9.1 8.9 - 10.3 mg/dL   Total Protein 7.7 6.5 - 8.1 g/dL   Albumin 4.0 3.5 - 5.0 g/dL   AST 27 15 - 41 U/L   ALT 33 17 - 63 U/L   Alkaline Phosphatase 80 38 - 126 U/L   Total Bilirubin 0.9 0.3 - 1.2 mg/dL   GFR calc non Af Amer >60 >60 mL/min   GFR calc Af Amer >60 >60 mL/min    Comment: (  NOTE) The eGFR has been calculated using the CKD EPI equation. This calculation has not been validated in all clinical situations. eGFR's persistently <60 mL/min signify possible Chronic Kidney Disease.    Anion gap 10 5 - 15  CBC     Status: None   Collection Time: 05/28/17  1:51 PM  Result Value Ref Range   WBC 8.3 4.0 - 10.5 K/uL   RBC 5.10 4.22 - 5.81 MIL/uL   Hemoglobin 16.6 13.0 - 17.0 g/dL   HCT 46.9 39.0 - 52.0 %   MCV 92.0 78.0 - 100.0 fL   MCH 32.5 26.0 - 34.0 pg   MCHC 35.4 30.0 - 36.0 g/dL   RDW 13.5 11.5 - 15.5 %   Platelets 232 150 - 400 K/uL  Glucose, capillary     Status: None   Collection Time: 05/28/17 10:18 PM  Result Value Ref Range   Glucose-Capillary 91 65 - 99 mg/dL   Comment 1 Notify RN    Comment 2 Document in Chart   Glucose, capillary     Status: None   Collection Time: 05/29/17  1:22 AM  Result Value Ref Range   Glucose-Capillary 99 65 - 99 mg/dL  Glucose, capillary     Status: Abnormal    Collection Time: 05/29/17  4:08 AM  Result Value Ref Range   Glucose-Capillary 100 (H) 65 - 99 mg/dL  Basic metabolic panel     Status: Abnormal   Collection Time: 05/29/17  5:54 AM  Result Value Ref Range   Sodium 138 135 - 145 mmol/L   Potassium 3.5 3.5 - 5.1 mmol/L   Chloride 104 101 - 111 mmol/L   CO2 25 22 - 32 mmol/L   Glucose, Bld 109 (H) 65 - 99 mg/dL   BUN 10 6 - 20 mg/dL   Creatinine, Ser 0.93 0.61 - 1.24 mg/dL   Calcium 8.4 (L) 8.9 - 10.3 mg/dL   GFR calc non Af Amer >60 >60 mL/min   GFR calc Af Amer >60 >60 mL/min    Comment: (NOTE) The eGFR has been calculated using the CKD EPI equation. This calculation has not been validated in all clinical situations. eGFR's persistently <60 mL/min signify possible Chronic Kidney Disease.    Anion gap 9 5 - 15  CBC     Status: None   Collection Time: 05/29/17  5:54 AM  Result Value Ref Range   WBC 8.1 4.0 - 10.5 K/uL   RBC 4.81 4.22 - 5.81 MIL/uL   Hemoglobin 15.3 13.0 - 17.0 g/dL   HCT 44.6 39.0 - 52.0 %   MCV 92.7 78.0 - 100.0 fL   MCH 31.8 26.0 - 34.0 pg   MCHC 34.3 30.0 - 36.0 g/dL   RDW 13.7 11.5 - 15.5 %   Platelets 215 150 - 400 K/uL  Glucose, capillary     Status: Abnormal   Collection Time: 05/29/17  7:45 AM  Result Value Ref Range   Glucose-Capillary 106 (H) 65 - 99 mg/dL   Comment 1 Notify RN    Comment 2 Document in Chart     Dg Chest Port 1 View  Result Date: 05/28/2017 CLINICAL DATA:  History small bowel obstruction. Recurrent nausea and vomiting beginning yesterday. The patient reports abdominal pain and distension for the past week. The patient has undergone nasogastric tube placement. EXAM: PORTABLE CHEST 1 VIEW COMPARISON:  Chest x-ray of May 22, 2017 FINDINGS: The esophagogastric tube tip lies in the gastric cardia and the proximal port  just above the GE junction. The lungs are hypoinflated but grossly clear. The cardiac silhouette is mildly enlarged. The central pulmonary vascularity is prominent.  IMPRESSION: Advancement of the nasogastric tube by approximately 10 cm is recommended to assure that the proximal port as well as the tip remain below the GE junction. Mild cardiomegaly.  No acute cardiopulmonary abnormality. Electronically Signed   By: David  Martinique M.D.   On: 05/28/2017 15:11   Dg Abd Portable 1v  Result Date: 05/28/2017 CLINICAL DATA:  Nausea, vomiting. History of small bowel obstruction. EXAM: PORTABLE ABDOMEN - 1 VIEW COMPARISON:  05/22/2017 CT FINDINGS: NG tube tip is in the proximal stomach with the side port in the distal esophagus. Relative paucity of gas in the abdomen with gas in a mildly prominent central small bowel loop concerning for continued small bowel obstruction. No organomegaly or free air. No suspicious calcification. IMPRESSION: NG tube tip in the proximal stomach with the side port in the distal esophagus. Mildly prominent central abdominal small bowel loop concerning for continued small bowel obstruction. Electronically Signed   By: Rolm Baptise M.D.   On: 05/28/2017 15:11    ROS:  Pertinent items are noted in HPI.  Blood pressure 112/63, pulse 76, temperature 98.3 F (36.8 C), temperature source Oral, resp. rate 18, height 5' 10"  (1.778 m), weight 217 lb 2.5 oz (98.5 kg), SpO2 93 %. Physical Exam: Pleasant well-developed well-nourished white male in no acute distress. Head is normocephalic, atraumatic Lungs clear auscultation with equal breath sounds bilaterally Heart examination reveals a regular rate and rhythm without S3, S4, murmurs Abdomen is soft with point tenderness at the umbilicus. No rigidity is noted. Bowel sounds are present.  CT scan report reviewed  Assessment/Plan: Impression: Localized dilated loop of bowel with small fat containing umbilical hernia. He states he has had this hernia fixed in the past. He does not show signs of overt bowel obstruction requiring surgical intervention at this time. Plan: Would continue NG tube  decompression for now. Will follow with you.  Aviva Signs 05/29/2017, 8:48 AM

## 2017-05-29 NOTE — Care Management Note (Signed)
Case Management Note  Patient Details  Name: Gary Marsh MRN: [id number] Date of Birth: 02/22/1951  Subjective/Objective:                  Chart Reviewed. No CM consult. Adm with SBO, From home [facility] ind PTA. Surgery following. No plans for surgery at this time.  Action/Plan: Anticipate DC with self care.    Expected Discharge Date:     05/30/2017             Expected Discharge Plan:  Home/Self Care  In-House Referral:     Discharge planning Services  CM Consult  Post Acute Care Choice:    Choice offered to:     DME Arranged:    DME Agency:     HH Arranged:    HH Agency:     Status of Service:  In process, will continue to follow  If discussed at Long Length of Stay Meetings, dates discussed:    Additional Comments:  Almeta Geisel, Chrystine OilerSharley Diane, RN 05/29/2017, 12:23 PM

## 2017-05-29 NOTE — Progress Notes (Signed)
PROGRESS NOTE    Gary GibsonClayburn Marsh  ZOX:[id number] DOB: [birth year] DOA: 05/28/2017 PCP: Patient, No Pcp Per    Brief Narrative: 66 yo with prior hernia repair, admitted with several bouts of SBO that resolved non surgically in the past.  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  He is on no chronic narcotics.  He has an NGT placed and surgery has been consulted.  He has no other complaints.    Assessment & Plan:   Principal Problem:   SBO (small bowel obstruction) (HCC) Active Problems:   Hyperglycemia   1. SBO:  Will continue with conservative Tx, continue NGT, IVF, and symtomatic Tx.  His lipase, LFT, CBC were unremarkable.   DVT prophylaxis: SCD Code Status:  FULLCODE. Family Communication: none at bedside.  Disposition Plan: Home.  Consultants:   Surgery.  Procedures:   None.  Antimicrobials: Anti-infectives    None       Subjective:  No complaints.  Objective: Vitals:   05/28/17 1816 05/28/17 1848 05/28/17 2139 05/29/17 0609  BP: (!) 125/101 112/79 110/66 112/63  Pulse: (!) 106 (!) 102 88 76  Resp:  18 20 18   Temp:  98.5 F (36.9 C) 98.3 F (36.8 C) 98.3 F (36.8 C)  TempSrc:  Oral Oral Oral  SpO2: 93% 96% 95% 93%  Weight:  98.5 kg (217 lb 2.5 oz)    Height:  5\' 10"  (1.778 m)      Intake/Output Summary (Last 24 hours) at 05/29/17 0824 Last data filed at 05/29/17 [id number]  Gross per 24 hour  Intake          2160.42 ml  Output              150 ml  Net          2010.42 ml   Filed Weights   05/28/17 1330 05/28/17 1848  Weight: 100.7 kg (222 lb) 98.5 kg (217 lb 2.5 oz)    Examination:  General exam: Appears calm and comfortable  Respiratory system: Clear to auscultation. Respiratory effort normal. Cardiovascular system: S1 & S2 heard, RRR. No JVD, murmurs, rubs, gallops or clicks. No pedal edema. Gastrointestinal system: Abdomen is nondistended, soft and nontender. No organomegaly or masses  felt. Normal bowel sounds heard. Central nervous system: Alert and oriented. No focal neurological deficits. Extremities: Symmetric 5 x 5 power. Skin: No rashes, lesions or ulcers Psychiatry: Judgement and insight appear normal. Mood & affect appropriate.   Data Reviewed: I have personally reviewed following labs and imaging studies  CBC:  Recent Labs Lab 05/22/17 1529 05/28/17 1351 05/29/17 0554  WBC 10.3 8.3 8.1  HGB 16.6 16.6 15.3  HCT 47.2 46.9 44.6  MCV 92.5 92.0 92.7  PLT 214 232 215   Basic Metabolic Panel:  Recent Labs Lab 05/22/17 1529 05/28/17 1351 05/29/17 0554  NA 136 135 138  K 3.9 3.8 3.5  CL 99* 101 104  CO2 29 24 25   GLUCOSE 104* 164* 109*  BUN 10 7 10   CREATININE 1.16 0.99 0.93  CALCIUM 8.8* 9.1 8.4*   GFR: Estimated Creatinine Clearance: 94.4 mL/min (by C-G formula based on SCr of 0.93 mg/dL). Liver Function Tests:  Recent Labs Lab 05/22/17 1529 05/28/17 1351  AST 23 27  ALT 28 33  ALKPHOS 65 80  BILITOT 1.0 0.9  PROT 7.6 7.7  ALBUMIN 4.1 4.0    Recent Labs Lab 05/22/17 1529 05/28/17 1351  LIPASE  23 25   CBG:  Recent Labs Lab 05/28/17 2218 05/29/17 0122 05/29/17 0408 05/29/17 0745  GLUCAP 91 99 100* 106*   Radiology Studies: Dg Chest Port 1 View  Result Date: 05/28/2017 CLINICAL DATA:  History small bowel obstruction. Recurrent nausea and vomiting beginning yesterday. The patient reports abdominal pain and distension for the past week. The patient has undergone nasogastric tube placement. EXAM: PORTABLE CHEST 1 VIEW COMPARISON:  Chest x-ray of May 22, 2017 FINDINGS: The esophagogastric tube tip lies in the gastric cardia and the proximal port just above the GE junction. The lungs are hypoinflated but grossly clear. The cardiac silhouette is mildly enlarged. The central pulmonary vascularity is prominent. IMPRESSION: Advancement of the nasogastric tube by approximately 10 cm is recommended to assure that the proximal port as  well as the tip remain below the GE junction. Mild cardiomegaly.  No acute cardiopulmonary abnormality. Electronically Signed   By: David  SwazilandJordan M.D.   On: 05/28/2017 15:11   Dg Abd Portable 1v  Result Date: 05/28/2017 CLINICAL DATA:  Nausea, vomiting. History of small bowel obstruction. EXAM: PORTABLE ABDOMEN - 1 VIEW COMPARISON:  05/22/2017 CT FINDINGS: NG tube tip is in the proximal stomach with the side port in the distal esophagus. Relative paucity of gas in the abdomen with gas in a mildly prominent central small bowel loop concerning for continued small bowel obstruction. No organomegaly or free air. No suspicious calcification. IMPRESSION: NG tube tip in the proximal stomach with the side port in the distal esophagus. Mildly prominent central abdominal small bowel loop concerning for continued small bowel obstruction. Electronically Signed   By: Charlett NoseKevin  Dover M.D.   On: 05/28/2017 15:11    Scheduled Meds: . insulin aspart  0-9 Units Subcutaneous Q4H  . pantoprazole  40 mg Oral Daily  . pneumococcal 23 valent vaccine  0.5 mL Intramuscular Tomorrow-1000  . pregabalin  150 mg Oral TID   Continuous Infusions: . sodium chloride 125 mL/hr at 05/29/17 0356     LOS: 0 days   Jen Benedict, MD [facility] Lawrence CampusFACP Hospitalist.   If 7PM-7AM, please contact night-coverage [email or web address] Password [facility] [email or web address] 05/29/2017, 8:24 AM

## 2017-05-29 NOTE — Progress Notes (Signed)
PROGRESS NOTE    Gary Marsh  WJX:[id number] DOB: [birth year] DOA: 05/28/2017 PCP: Patient, No Pcp Per    Brief Narrative: 66 yo with prior hernia repair, admitted with several bouts of SBO that resolved non surgically in the past.  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  He is on no chronic narcotics.  He has an NGT placed and surgery has been consulted.  He has no other complaints.    Assessment & Plan:   Principal Problem:   SBO (small bowel obstruction) (HCC) Active Problems:   Hyperglycemia   1. SBO:  Will continue with conservative Tx, continue NGT, IVF, and symtomatic Tx.  His lipase, LFT, CBC were unremarkable.   DVT prophylaxis: SCD Code Status:  FULLCODE. Family Communication: none at bedside.  Disposition Plan: Home.  Consultants:   Surgery.  Procedures:   None.   Antimicrobials: Anti-infectives    None       Subjective;  " I am OK"  Objective: Vitals:   05/28/17 1816 05/28/17 1848 05/28/17 2139 05/29/17 0609  BP: (!) 125/101 112/79 110/66 112/63  Pulse: (!) 106 (!) 102 88 76  Resp:  18 20 18   Temp:  98.5 F (36.9 C) 98.3 F (36.8 C) 98.3 F (36.8 C)  TempSrc:  Oral Oral Oral  SpO2: 93% 96% 95% 93%  Weight:  98.5 kg (217 lb 2.5 oz)    Height:  5\' 10"  (1.778 m)      Intake/Output Summary (Last 24 hours) at 05/29/17 0824 Last data filed at 05/29/17 [id number]  Gross per 24 hour  Intake          2160.42 ml  Output              150 ml  Net          2010.42 ml   Filed Weights   05/28/17 1330 05/28/17 1848  Weight: 100.7 kg (222 lb) 98.5 kg (217 lb 2.5 oz)    Examination:  General exam: Appears calm and comfortable  Respiratory system: Clear to auscultation. Respiratory effort normal. Cardiovascular system: S1 & S2 heard, RRR. No JVD, murmurs, rubs, gallops or clicks. No pedal edema. Gastrointestinal system: Abdomen is nondistended, soft and nontender. No organomegaly or masses felt.  Normal bowel sounds heard. Central nervous system: Alert and oriented. No focal neurological deficits. Extremities: Symmetric 5 x 5 power. Skin: No rashes, lesions or ulcers Psychiatry: Judgement and insight appear normal. Mood & affect appropriate.   Data Reviewed: I have personally reviewed following labs and imaging studies  CBC:  Recent Labs Lab 05/22/17 1529 05/28/17 1351 05/29/17 0554  WBC 10.3 8.3 8.1  HGB 16.6 16.6 15.3  HCT 47.2 46.9 44.6  MCV 92.5 92.0 92.7  PLT 214 232 215   Basic Metabolic Panel:  Recent Labs Lab 05/22/17 1529 05/28/17 1351 05/29/17 0554  NA 136 135 138  K 3.9 3.8 3.5  CL 99* 101 104  CO2 29 24 25   GLUCOSE 104* 164* 109*  BUN 10 7 10   CREATININE 1.16 0.99 0.93  CALCIUM 8.8* 9.1 8.4*   Liver Function Tests:  Recent Labs Lab 05/22/17 1529 05/28/17 1351  AST 23 27  ALT 28 33  ALKPHOS 65 80  BILITOT 1.0 0.9  PROT 7.6 7.7  ALBUMIN 4.1 4.0    Recent Labs Lab 05/22/17 1529 05/28/17 1351  LIPASE 23 25    Recent Labs Lab 05/28/17 2218 05/29/17 0122  05/29/17 0408 05/29/17 0745  GLUCAP 91 99 100* 106*    Radiology Studies: Dg Chest Port 1 View  Result Date: 05/28/2017 CLINICAL DATA:  History small bowel obstruction. Recurrent nausea and vomiting beginning yesterday. The patient reports abdominal pain and distension for the past week. The patient has undergone nasogastric tube placement. EXAM: PORTABLE CHEST 1 VIEW COMPARISON:  Chest x-ray of May 22, 2017 FINDINGS: The esophagogastric tube tip lies in the gastric cardia and the proximal port just above the GE junction. The lungs are hypoinflated but grossly clear. The cardiac silhouette is mildly enlarged. The central pulmonary vascularity is prominent. IMPRESSION: Advancement of the nasogastric tube by approximately 10 cm is recommended to assure that the proximal port as well as the tip remain below the GE junction. Mild cardiomegaly.  No acute cardiopulmonary abnormality.  Electronically Signed   By: David  SwazilandJordan M.D.   On: 05/28/2017 15:11   Dg Abd Portable 1v  Result Date: 05/28/2017 CLINICAL DATA:  Nausea, vomiting. History of small bowel obstruction. EXAM: PORTABLE ABDOMEN - 1 VIEW COMPARISON:  05/22/2017 CT FINDINGS: NG tube tip is in the proximal stomach with the side port in the distal esophagus. Relative paucity of gas in the abdomen with gas in a mildly prominent central small bowel loop concerning for continued small bowel obstruction. No organomegaly or free air. No suspicious calcification. IMPRESSION: NG tube tip in the proximal stomach with the side port in the distal esophagus. Mildly prominent central abdominal small bowel loop concerning for continued small bowel obstruction. Electronically Signed   By: Charlett NoseKevin  Dover M.D.   On: 05/28/2017 15:11    Scheduled Meds: . insulin aspart  0-9 Units Subcutaneous Q4H  . pantoprazole  40 mg Oral Daily  . pneumococcal 23 valent vaccine  0.5 mL Intramuscular Tomorrow-1000  . pregabalin  150 mg Oral TID   Continuous Infusions: . sodium chloride 125 mL/hr at 05/29/17 0356     LOS: 0 days   Aara Jacquot, MD [facility] HospitalFACP Hospitalist.   If 7PM-7AM, please contact night-coverage [email or web address] Password [facility] [email or web address] 05/29/2017, 8:24 AM

## 2017-05-29 NOTE — Progress Notes (Signed)
Received verbal order from MD to clamp NG tube for PO administration.

## 2017-05-30 LAB — GLUCOSE, CAPILLARY
GLUCOSE-CAPILLARY: 82 mg/dL (ref 65–99)
GLUCOSE-CAPILLARY: 87 mg/dL (ref 65–99)
GLUCOSE-CAPILLARY: 92 mg/dL (ref 65–99)
Glucose-Capillary: 88 mg/dL (ref 65–99)
Glucose-Capillary: 82 mg/dL (ref 65–99)

## 2017-05-30 LAB — HIV ANTIBODY (ROUTINE TESTING W REFLEX): HIV SCREEN 4TH GENERATION: NONREACTIVE

## 2017-05-30 LAB — HEMOGLOBIN A1C
HEMOGLOBIN A1C: 5.9 % — AB (ref 4.8–5.6)
MEAN PLASMA GLUCOSE: 123 mg/dL

## 2017-05-30 MED ORDER — POLYVINYL ALCOHOL 1.4 % OP SOLN
1.0000 [drp] | OPHTHALMIC | Status: DC | PRN
  Administered 2017-05-30: 1 [drp] via OPHTHALMIC
  Filled 2017-05-30: qty 15

## 2017-05-30 NOTE — Progress Notes (Signed)
PROGRESS NOTE    Gary GibsonClayburn Shipper  [id number] DOB: 05/04/1951 DOA: 05/28/2017 PCP: Patient, No Pcp Per    Brief Narrative:  66 yo with prior hernia repair, admitted with several bouts of SBO that resolved non surgically in the past.  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  He is on no chronic narcotics.  He has an NGT placed and surgery has been consulted.  He has no other complaints. There is still discomfort, distention, and significant NGT return.   Assessment & Plan:   Principal Problem:   SBO (small bowel obstruction) (HCC) Active Problems:   Hyperglycemia   1. SBO:  Will continue with conservative Tx, continue NGT, IVF, and symtomatic Tx.  His lipase, LFT, CBC were unremarkable.   DVT prophylaxis:  SCD. Code Status: FULL CODE.  Family Communication: None.  Disposition Plan: Home.   Consultants:   Surgery.  Procedures:   None.   Antimicrobials: Anti-infectives    None       Subjective:  "Not too good"  Objective: Vitals:   05/29/17 0609 05/29/17 1424 05/29/17 2117 05/30/17 0532  BP: 112/63 107/67 (!) 115/58 (!) 106/54  Pulse: 76 68 65 71  Resp: 18 18 20 18   Temp: 98.3 F (36.8 C) 98.9 F (37.2 C) 98.6 F (37 C) 98.4 F (36.9 C)  TempSrc: Oral Oral Oral Oral  SpO2: 93% 95% 98% 95%  Weight:      Height:        Intake/Output Summary (Last 24 hours) at 05/30/17 0855 Last data filed at 05/30/17 0618  Gross per 24 hour  Intake          2981.25 ml  Output             1370 ml  Net          1611.25 ml   Filed Weights   05/28/17 1330 05/28/17 1848  Weight: 100.7 kg (222 lb) 98.5 kg (217 lb 2.5 oz)    Examination:  General exam: Appears calm and comfortable  Respiratory system: Clear to auscultation. Respiratory effort normal. Cardiovascular system: S1 & S2 heard, RRR. No JVD, murmurs, rubs, gallops or clicks. No pedal edema. Gastrointestinal system: Abdomen is nondistended, soft and  nontender. No organomegaly or masses felt. Normal bowel sounds heard. Central nervous system: Alert and oriented. No focal neurological deficits. Extremities: Symmetric 5 x 5 power. Skin: No rashes, lesions or ulcers Psychiatry: Judgement and insight appear normal. Mood & affect appropriate.   Data Reviewed: I have personally reviewed following labs and imaging studies  CBC:  Recent Labs Lab 05/28/17 1351 05/29/17 0554  WBC 8.3 8.1  HGB 16.6 15.3  HCT 46.9 44.6  MCV 92.0 92.7  PLT 232 215   Basic Metabolic Panel:  Recent Labs Lab 05/28/17 1351 05/29/17 0554  NA 135 138  K 3.8 3.5  CL 101 104  CO2 24 25  GLUCOSE 164* 109*  BUN 7 10  CREATININE 0.99 0.93  CALCIUM 9.1 8.4*   GFR: Estimated Creatinine Clearance: 94.4 mL/min (by C-G formula based on SCr of 0.93 mg/dL). Liver Function Tests:  Recent Labs Lab 05/28/17 1351  AST 27  ALT 33  ALKPHOS 80  BILITOT 0.9  PROT 7.7  ALBUMIN 4.0    Recent Labs Lab 05/28/17 1351  LIPASE 25   HbA1C:  Recent Labs  05/28/17 1351  HGBA1C 5.9*   CBG:  Recent Labs Lab 05/29/17 1618  05/29/17 2047 05/30/17 0006 05/30/17 0406 05/30/17 0820  GLUCAP 84 84 92 88 82   Lipid Profile: Radiology Studies: Dg Chest Port 1 View  Result Date: 05/28/2017 CLINICAL DATA:  History small bowel obstruction. Recurrent nausea and vomiting beginning yesterday. The patient reports abdominal pain and distension for the past week. The patient has undergone nasogastric tube placement. EXAM: PORTABLE CHEST 1 VIEW COMPARISON:  Chest x-ray of May 22, 2017 FINDINGS: The esophagogastric tube tip lies in the gastric cardia and the proximal port just above the GE junction. The lungs are hypoinflated but grossly clear. The cardiac silhouette is mildly enlarged. The central pulmonary vascularity is prominent. IMPRESSION: Advancement of the nasogastric tube by approximately 10 cm is recommended to assure that the proximal port as well as the tip  remain below the GE junction. Mild cardiomegaly.  No acute cardiopulmonary abnormality. Electronically Signed   By: David  SwazilandJordan M.D.   On: 05/28/2017 15:11   Dg Abd Portable 1v  Result Date: 05/28/2017 CLINICAL DATA:  Nausea, vomiting. History of small bowel obstruction. EXAM: PORTABLE ABDOMEN - 1 VIEW COMPARISON:  05/22/2017 CT FINDINGS: NG tube tip is in the proximal stomach with the side port in the distal esophagus. Relative paucity of gas in the abdomen with gas in a mildly prominent central small bowel loop concerning for continued small bowel obstruction. No organomegaly or free air. No suspicious calcification. IMPRESSION: NG tube tip in the proximal stomach with the side port in the distal esophagus. Mildly prominent central abdominal small bowel loop concerning for continued small bowel obstruction. Electronically Signed   By: Charlett NoseKevin  Dover M.D.   On: 05/28/2017 15:11    Scheduled Meds: . insulin aspart  0-9 Units Subcutaneous Q4H  . pantoprazole  40 mg Oral Daily  . pregabalin  150 mg Oral TID   Continuous Infusions: . sodium chloride 125 mL/hr at 05/30/17 0513     LOS: 1 day   Zayvian Mcmurtry, MD FACP Hospitalist.   If 7PM-7AM, please contact night-coverage [email or web address] Password [email or web address] 05/30/2017, 8:55 AM

## 2017-05-30 NOTE — Progress Notes (Signed)
  Subjective: Has not passed flatus or had bowel movement in past 24 hours. Denies any abdominal pain.  Objective: Vital signs in last 24 hours: Temp:  [98.4 F (36.9 C)-98.9 F (37.2 C)] 98.4 F (36.9 C) (08/03 0532) Pulse Rate:  [65-71] 71 (08/03 0532) Resp:  [18-20] 18 (08/03 0532) BP: (106-115)/(54-67) 106/54 (08/03 0532) SpO2:  [95 %-98 %] 95 % (08/03 0532) Last BM Date: 05/28/17  Intake/Output from previous day: 08/02 0701 - 08/03 0700 In: 2981.3 [I.V.:2981.3] Out: 1370 [Urine:300; Emesis/NG output:1070] Intake/Output this shift: No intake/output data recorded.  General appearance: alert, cooperative and no distress GI: Soft, nontender, not particularly distended. No rigidity noted. Occasional bowel sounds appreciated.  Lab Results:   Recent Labs  05/28/17 1351 05/29/17 0554  WBC 8.3 8.1  HGB 16.6 15.3  HCT 46.9 44.6  PLT 232 215   BMET  Recent Labs  05/28/17 1351 05/29/17 0554  NA 135 138  K 3.8 3.5  CL 101 104  CO2 24 25  GLUCOSE 164* 109*  BUN 7 10  CREATININE 0.99 0.93  CALCIUM 9.1 8.4*   PT/INR No results for input(s): LABPROT, INR in the last 72 hours.  Studies/Results: Dg Chest Port 1 View  Result Date: 05/28/2017 CLINICAL DATA:  History small bowel obstruction. Recurrent nausea and vomiting beginning yesterday. The patient reports abdominal pain and distension for the past week. The patient has undergone nasogastric tube placement. EXAM: PORTABLE CHEST 1 VIEW COMPARISON:  Chest x-ray of May 22, 2017 FINDINGS: The esophagogastric tube tip lies in the gastric cardia and the proximal port just above the GE junction. The lungs are hypoinflated but grossly clear. The cardiac silhouette is mildly enlarged. The central pulmonary vascularity is prominent. IMPRESSION: Advancement of the nasogastric tube by approximately 10 cm is recommended to assure that the proximal port as well as the tip remain below the GE junction. Mild cardiomegaly.  No acute  cardiopulmonary abnormality. Electronically Signed   By: David  [place]Jordan M.D.   On: 05/28/2017 15:11   Dg Abd Portable 1v  Result Date: 05/28/2017 CLINICAL DATA:  Nausea, vomiting. History of small bowel obstruction. EXAM: PORTABLE ABDOMEN - 1 VIEW COMPARISON:  05/22/2017 CT FINDINGS: NG tube tip is in the proximal stomach with the side port in the distal esophagus. Relative paucity of gas in the abdomen with gas in a mildly prominent central small bowel loop concerning for continued small bowel obstruction. No organomegaly or free air. No suspicious calcification. IMPRESSION: NG tube tip in the proximal stomach with the side port in the distal esophagus. Mildly prominent central abdominal small bowel loop concerning for continued small bowel obstruction. Electronically Signed   By: Charlett NoseKevin  Dover M.D.   On: 05/28/2017 15:11    Anti-infectives: Anti-infectives    None      Assessment/Plan: Impression: Partial small bowel obstruction Plan: No need for acute surgical intervention. Continue NG tube decompression. I will be leaving town tomorrow. Please check Amion two see who is covering.  LOS: 1 day    Franky MachoMark Adamaris King 05/30/2017

## 2017-05-31 ENCOUNTER — Inpatient Hospital Stay (HOSPITAL_COMMUNITY): Payer: Medicare Other

## 2017-05-31 LAB — GLUCOSE, CAPILLARY
GLUCOSE-CAPILLARY: 70 mg/dL (ref 65–99)
GLUCOSE-CAPILLARY: 75 mg/dL (ref 65–99)
Glucose-Capillary: 79 mg/dL (ref 65–99)
Glucose-Capillary: 77 mg/dL (ref 65–99)
Glucose-Capillary: 70 mg/dL (ref 65–99)
Glucose-Capillary: 73 mg/dL (ref 65–99)
Glucose-Capillary: 74 mg/dL (ref 65–99)

## 2017-05-31 NOTE — Progress Notes (Signed)
  General Surgery [facility]  Assessment & Plan: HD#4 partial SBO, recurrent  IVF, NG, NPO  Will repeat AXR today and compare to 8/1 study  Encouraged OOB,ambulation in halls today  Will follow        Velora Hecklerodd M. Chevon Laufer, MD, [facility] CenterFACS       [facility]       Office: [phone]    Chief Complaint: Small bowel obstruction  Subjective: Patient comfortable, nurse in room.  No flatus or BM.  Minimal pain.  Has not been OOB.  Objective: Vital signs in last 24 hours: Temp:  [98.1 F (36.7 C)-98.9 F (37.2 C)] 98.9 F (37.2 C) (08/03 2012) Pulse Rate:  [60-73] 73 (08/03 2012) Resp:  [20] 20 (08/03 2012) BP: (113-121)/(56-64) 121/64 (08/03 2012) SpO2:  [97 %-98 %] 98 % (08/03 2012) Last BM Date: 05/28/17  Intake/Output from previous day: 08/03 0701 - 08/04 0700 In: 1087.5 [I.V.:1087.5] Out: 1100 [Urine:600; Emesis/NG output:500] Intake/Output this shift: No intake/output data recorded.  Physical Exam: HEENT - sclerae clear, mucous membranes moist Neck - soft Chest - clear bilaterally Cor - RRR Abdomen - soft with mild distension; no hernia; mild tender LLQ; no mass; few BS present Ext - no edema, non-tender Neuro - alert & oriented, no focal deficits  Lab Results:   Recent Labs  05/28/17 1351 05/29/17 0554  WBC 8.3 8.1  HGB 16.6 15.3  HCT 46.9 44.6  PLT 232 215   BMET  Recent Labs  05/28/17 1351 05/29/17 0554  NA 135 138  K 3.8 3.5  CL 101 104  CO2 24 25  GLUCOSE 164* 109*  BUN 7 10  CREATININE 0.99 0.93  CALCIUM 9.1 8.4*   PT/INR No results for input(s): LABPROT, INR in the last 72 hours. Comprehensive Metabolic Panel:    Component Value Date/Time   NA 138 05/29/2017 0554   NA 135 05/28/2017 1351   K 3.5 05/29/2017 0554   K 3.8 05/28/2017 1351   CL 104 05/29/2017 0554   CL 101 05/28/2017 1351   CO2 25 05/29/2017 0554   CO2 24 05/28/2017 1351   BUN 10 05/29/2017 0554   BUN 7 05/28/2017 1351   CREATININE  0.93 05/29/2017 0554   CREATININE 0.99 05/28/2017 1351   CREATININE 0.83 12/22/2012 1415   CREATININE 0.78 02/28/2011 0950   GLUCOSE 109 (H) 05/29/2017 0554   GLUCOSE 164 (H) 05/28/2017 1351   CALCIUM 8.4 (L) 05/29/2017 0554   CALCIUM 9.1 05/28/2017 1351   AST 27 05/28/2017 1351   AST 23 05/22/2017 1529   ALT 33 05/28/2017 1351   ALT 28 05/22/2017 1529   ALKPHOS 80 05/28/2017 1351   ALKPHOS 65 05/22/2017 1529   BILITOT 0.9 05/28/2017 1351   BILITOT 1.0 05/22/2017 1529   PROT 7.7 05/28/2017 1351   PROT 7.6 05/22/2017 1529   ALBUMIN 4.0 05/28/2017 1351   ALBUMIN 4.1 05/22/2017 1529    Studies/Results: No results found.    [name] 05/31/2017  Patient ID: Gary Marsh, male   DOB: Jun 15, 1951, 66 y.o.   MRN: [id number]

## 2017-05-31 NOTE — Progress Notes (Signed)
PROGRESS NOTE    Eppie GibsonClayburn Barocio  ZOX:[id number] DOB: 1950/12/25 DOA: 05/28/2017 PCP: Patient, No Pcp Per    Brief Narrative:   66 yo with prior hernia repair, admitted with several bouts of SBO that resolved non surgically in the past. 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 He is on no chronic narcotics. He has an NGT placed and surgery has been consulted. He has no other complaints. There is still discomfort, distention, and significant NGT return.  KUB showed mild improvement.    Assessment & Plan:   Principal Problem:   SBO (small bowel obstruction) (HCC) Active Problems:   Hyperglycemia    1. SBO: Will continue with conservative Tx, continue NGT, IVF, and symtomatic Tx. His lipase, LFT, CBC were unremarkable.  KUB showed mild improvement, but still significant NGT return.  Will do PPN or TPN next week if required.   DVT prophylaxis: SCD. Code Status: FULL CODE.  Family Communication: None.  Disposition Plan: Home.   Consultants:   Surgery.   Procedures:   None.   Antimicrobials: Anti-infectives    None       Subjective: "still not good"   Objective: Vitals:   05/29/17 2117 05/30/17 0532 05/30/17 1333 05/30/17 2012  BP: (!) 115/58 (!) 106/54 (!) 113/56 121/64  Pulse: 65 71 60 73  Resp: 20 18 20 20   Temp:  98.4 F (36.9 C) 98.1 F (36.7 C) 98.9 F (37.2 C)  TempSrc:  Oral Oral Oral  SpO2: 98% 95% 97% 98%  Weight:      Height:        Intake/Output Summary (Last 24 hours) at 05/31/17 1245 Last data filed at 05/31/17 1000  Gross per 24 hour  Intake           1087.5 ml  Output             2000 ml  Net           -912.5 ml   Filed Weights   05/28/17 1330 05/28/17 1848  Weight: 100.7 kg (222 lb) 98.5 kg (217 lb 2.5 oz)    Examination:  General exam: Appears calm and comfortable  Respiratory system: Clear to auscultation. Respiratory effort normal. Cardiovascular system: S1 & S2  heard, RRR. No JVD, murmurs, rubs, gallops or clicks. No pedal edema. Gastrointestinal system: Abdomen is nondistended, soft and nontender. No organomegaly or masses felt. Normal bowel sounds heard. Central nervous system: Alert and oriented. No focal neurological deficits. Extremities: Symmetric 5 x 5 power. Skin: No rashes, lesions or ulcers Psychiatry: Judgement and insight appear normal. Mood & affect appropriate.   Data Reviewed: I have personally reviewed following labs and imaging studies  CBC:  Recent Labs Lab 05/28/17 1351 05/29/17 0554  WBC 8.3 8.1  HGB 16.6 15.3  HCT 46.9 44.6  MCV 92.0 92.7  PLT 232 215   Basic Metabolic Panel:  Recent Labs Lab 05/28/17 1351 05/29/17 0554  NA 135 138  K 3.8 3.5  CL 101 104  CO2 24 25  GLUCOSE 164* 109*  BUN 7 10  CREATININE 0.99 0.93  CALCIUM 9.1 8.4*   GFR: Estimated Creatinine Clearance: 94.4 mL/min (by C-G formula based on SCr of 0.93 mg/dL). Liver Function Tests:  Recent Labs Lab 05/28/17 1351  AST 27  ALT 33  ALKPHOS 80  BILITOT 0.9  PROT 7.7  ALBUMIN 4.0    Recent Labs Lab 05/28/17 1351  LIPASE  25   HbA1C:  Recent Labs  05/28/17 1351  HGBA1C 5.9*   CBG:  Recent Labs Lab 05/30/17 2007 05/31/17 0034 05/31/17 0412 05/31/17 0807 05/31/17 1142  GLUCAP 82 75 70 79 77   Lipid Profile:   Radiology Studies: Dg Abd 2 Views  Result Date: 05/31/2017 CLINICAL DATA:  66 year old male with a history of small bowel obstruction EXAM: ABDOMEN - 2 VIEW COMPARISON:  Prior abdominal radiographs 05/28/2017. FINDINGS: A nasogastric tube is present. The tip of the tube is in the descending duodenum and the proximal side port is in the region of the pylorus. Improved bowel gas pattern. No dilated gas-filled loops of small bowel are evident. Gas is noted throughout the colon. Surgical clips in a right upper quadrant consistent with prior cholecystectomy. Lower lumbar degenerative disc disease. IMPRESSION: 1.  Improving bowel gas pattern. No evidence of residual small bowel obstruction. 2. The tip of the gastric tube is located within the descending duodenum. Electronically Signed   By: Malachy MoanHeath  McCullough M.D.   On: 05/31/2017 11:12    Scheduled Meds: . insulin aspart  0-9 Units Subcutaneous Q4H  . pantoprazole  40 mg Oral Daily  . pregabalin  150 mg Oral TID   Continuous Infusions: . sodium chloride 125 mL/hr at 05/31/17 1140     LOS: 2 days   Shonette Rhames, MD [facility] HospitalFACP Hospitalist.   If 7PM-7AM, please contact night-coverage [email or web address] Password [email or web address] 05/31/2017, 12:45 PM

## 2017-06-01 LAB — GLUCOSE, CAPILLARY
GLUCOSE-CAPILLARY: 105 mg/dL — AB (ref 65–99)
GLUCOSE-CAPILLARY: 147 mg/dL — AB (ref 65–99)
Glucose-Capillary: 70 mg/dL (ref 65–99)
Glucose-Capillary: 81 mg/dL (ref 65–99)
Glucose-Capillary: 102 mg/dL — ABNORMAL HIGH (ref 65–99)
Glucose-Capillary: 108 mg/dL — ABNORMAL HIGH (ref 65–99)

## 2017-06-01 MED ORDER — KCL IN DEXTROSE-NACL 20-5-0.9 MEQ/L-%-% IV SOLN
INTRAVENOUS | Status: DC
  Administered 2017-06-01 – 2017-06-03 (×6): via INTRAVENOUS

## 2017-06-01 MED ORDER — BISACODYL 10 MG RE SUPP
10.0000 mg | Freq: Once | RECTAL | Status: AC
  Administered 2017-06-01: 10 mg via RECTAL
  Filled 2017-06-01: qty 1

## 2017-06-01 NOTE — Progress Notes (Signed)
Patient ID: Gary GibsonClayburn Marsh, male   DOB: 1951/02/11, 66 y.o.   MRN: [id number]  Plain films show no sign of SB distention with air throughout the colon.  No radiologic signs of SBO.  Only 900 ml of NG output yesterday.  Will clamp NG, allow clear liquids, and give Dulcolax suppository.    I will see patient later today.  Covering for Dr. Waylan BogaJenkins  Elray Dains K. Corliss Skainssuei, MD, [facility]  General/ Trauma Surgery  06/01/2017 9:01 AM

## 2017-06-01 NOTE — Progress Notes (Signed)
PROGRESS NOTE    Gary GibsonClayburn Deshler  GNF:[id number] DOB: [birth year] DOA: 05/28/2017 PCP: Patient, No Pcp Per    Brief Narrative: 66 yo with prior hernia repair, admitted with several bouts of SBO that resolved non surgically in the past. 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 He is on no chronic narcotics. He has an NGT placed and surgery has been consulted. He has no other complaints. There is still discomfort, distention, and significant NGT return.  KUB showed mild improvement.    Assessment & Plan:   Principal Problem:   SBO (small bowel obstruction) (HCC) Active Problems:   Hyperglycemia  1. SBO: Will continue with conservative Tx, continue NGT, IVF, and symtomatic Tx. His lipase, LFT, CBC were unremarkable.  KUB showed mild improvement, but still significant NGT return.  Will do PPN or TPN next week if required. Change IVF to D5 with K.  DVT prophylaxis: SCD. Code Status: FULL CODE.  Family Communication: None.  Disposition Plan: Home.   Consultants:   Surgery.   Procedures:   None.   Antimicrobials: Anti-infectives    None       Subjective:   I still haven't passed any gas.    Objective: Vitals:   05/30/17 2012 05/31/17 1343 05/31/17 2124 06/01/17 0528  BP: 121/64 (!) 117/49 (!) 121/59 (!) 115/58  Pulse: 73 63 62 69  Resp: 20 19 18 16   Temp: 98.9 F (37.2 C) 98.3 F (36.8 C) 98.4 F (36.9 C) 97.6 F (36.4 C)  TempSrc: Oral Oral Oral Oral  SpO2: 98% 96% 93% 98%  Weight:      Height:        Intake/Output Summary (Last 24 hours) at 06/01/17 0806 Last data filed at 06/01/17 0528  Gross per 24 hour  Intake                0 ml  Output             3400 ml  Net            -3400 ml   Filed Weights   05/28/17 1330 05/28/17 1848  Weight: 100.7 kg (222 lb) 98.5 kg (217 lb 2.5 oz)    Examination:  General exam: Appears calm and comfortable  Respiratory system: Clear to auscultation.  Respiratory effort normal. Cardiovascular system: S1 & S2 heard, RRR. No JVD, murmurs, rubs, gallops or clicks. No pedal edema. Gastrointestinal system: Abdomen is nondistended, soft and nontender. No organomegaly or masses felt. Normal bowel sounds heard. Central nervous system: Alert and oriented. No focal neurological deficits. Extremities: Symmetric 5 x 5 power. Skin: No rashes, lesions or ulcers Psychiatry: Judgement and insight appear normal. Mood & affect appropriate.   Data Reviewed: I have personally reviewed following labs and imaging studies  CBC:  Recent Labs Lab 05/28/17 1351 05/29/17 0554  WBC 8.3 8.1  HGB 16.6 15.3  HCT 46.9 44.6  MCV 92.0 92.7  PLT 232 215   Basic Metabolic Panel:  Recent Labs Lab 05/28/17 1351 05/29/17 0554  NA 135 138  K 3.8 3.5  CL 101 104  CO2 24 25  GLUCOSE 164* 109*  BUN 7 10  CREATININE 0.99 0.93  CALCIUM 9.1 8.4*   GFR: Estimated Creatinine Clearance: 94.4 mL/min (by C-G formula based on SCr of 0.93 mg/dL). Liver Function Tests:  Recent Labs Lab 05/28/17 1351  AST 27  ALT 33  ALKPHOS 80  BILITOT 0.9  PROT 7.7  ALBUMIN 4.0    Recent Labs Lab 05/28/17 1351  LIPASE 25   CBG:  Recent Labs Lab 05/31/17 1632 05/31/17 2010 05/31/17 2314 06/01/17 0353 06/01/17 0738  GLUCAP 70 73 74 70 81    Radiology Studies: Dg Abd 2 Views  Result Date: 05/31/2017 CLINICAL DATA:  66 year old male with a history of small bowel obstruction EXAM: ABDOMEN - 2 VIEW COMPARISON:  Prior abdominal radiographs 05/28/2017. FINDINGS: A nasogastric tube is present. The tip of the tube is in the descending duodenum and the proximal side port is in the region of the pylorus. Improved bowel gas pattern. No dilated gas-filled loops of small bowel are evident. Gas is noted throughout the colon. Surgical clips in a right upper quadrant consistent with prior cholecystectomy. Lower lumbar degenerative disc disease. IMPRESSION: 1. Improving bowel gas  pattern. No evidence of residual small bowel obstruction. 2. The tip of the gastric tube is located within the descending duodenum. Electronically Signed   By: Malachy MoanHeath  McCullough M.D.   On: 05/31/2017 11:12    Scheduled Meds: . insulin aspart  0-9 Units Subcutaneous Q4H  . pantoprazole  40 mg Oral Daily  . pregabalin  150 mg Oral TID   Continuous Infusions: . sodium chloride 125 mL/hr at 06/01/17 0521     LOS: 3 days   Cristi Gwynn, MD [facility]urgicenterFACP Hospitalist.   If 7PM-7AM, please contact night-coverage [email or web address] Password [email or web address] 06/01/2017, 8:06 AM

## 2017-06-01 NOTE — Progress Notes (Signed)
   Subjective/Chief Complaint: Patient had a bowel movement earlier today with a Dulcolax suppository However, he felt a bit nauseated and his NG was unclamped.  Still with large amounts of watery output No abdominal pain   Objective: Vital signs in last 24 hours: Temp:  [97.6 F (36.4 C)-98.4 F (36.9 C)] 97.6 F (36.4 C) (08/05 0528) Pulse Rate:  [62-69] 69 (08/05 0528) Resp:  [16-18] 16 (08/05 0528) BP: (115-121)/(58-59) 115/58 (08/05 0528) SpO2:  [93 %-98 %] 98 % (08/05 0528) Last BM Date: 05/28/17  Intake/Output from previous day: 08/04 0701 - 08/05 0700 In: 0  Out: 3400 [Urine:2500; Emesis/NG output:900] Intake/Output this shift: Total I/O In: 670 [P.O.:670] Out: 3151 [Urine:1250; Emesis/NG output:1900; Stool:1]  General appearance: alert, cooperative and no distress GI: soft, mildly distended; non-tender; no peritonitis  Lab Results:  No results for input(s): WBC, HGB, HCT, PLT in the last 72 hours. BMET No results for input(s): NA, K, CL, CO2, GLUCOSE, BUN, CREATININE, CALCIUM in the last 72 hours. PT/INR No results for input(s): LABPROT, INR in the last 72 hours. ABG No results for input(s): PHART, HCO3 in the last 72 hours.  Invalid input(s): PCO2, PO2  Studies/Results: Dg Abd 2 Views  Result Date: 05/31/2017 CLINICAL DATA:  66 year old male with a history of small bowel obstruction EXAM: ABDOMEN - 2 VIEW COMPARISON:  Prior abdominal radiographs 05/28/2017. FINDINGS: A nasogastric tube is present. The tip of the tube is in the descending duodenum and the proximal side port is in the region of the pylorus. Improved bowel gas pattern. No dilated gas-filled loops of small bowel are evident. Gas is noted throughout the colon. Surgical clips in a right upper quadrant consistent with prior cholecystectomy. Lower lumbar degenerative disc disease. IMPRESSION: 1. Improving bowel gas pattern. No evidence of residual small bowel obstruction. 2. The tip of the gastric tube  is located within the descending duodenum. Electronically Signed   By: Malachy MoanHeath  McCullough M.D.   On: 05/31/2017 11:12    Anti-infectives: Anti-infectives    None      Assessment/Plan: Partial SBO - recurrent Continue sips of clears Will clamp NG overnight Encourage ambulation    LOS: 3 days    Sherry Rogus K. 06/01/2017

## 2017-06-02 LAB — COMPREHENSIVE METABOLIC PANEL
ALBUMIN: 3.4 g/dL — AB (ref 3.5–5.0)
ALT: 43 U/L (ref 17–63)
AST: 51 U/L — AB (ref 15–41)
Alkaline Phosphatase: 65 U/L (ref 38–126)
Anion gap: 7 (ref 5–15)
BUN: 5 mg/dL — AB (ref 6–20)
CHLORIDE: 106 mmol/L (ref 101–111)
CO2: 25 mmol/L (ref 22–32)
CREATININE: 0.8 mg/dL (ref 0.61–1.24)
Calcium: 8.5 mg/dL — ABNORMAL LOW (ref 8.9–10.3)
GFR calc Af Amer: >60 mL/min (ref >60)
GLUCOSE: 122 mg/dL — AB (ref 65–99)
Potassium: 3.2 mmol/L — ABNORMAL LOW (ref 3.5–5.1)
SODIUM: 138 mmol/L (ref 135–145)
Total Bilirubin: 1 mg/dL (ref 0.3–1.2)
Total Protein: 6.4 g/dL — ABNORMAL LOW (ref 6.5–8.1)

## 2017-06-02 LAB — CBC
HCT: 39.8 % (ref 39.0–52.0)
Hemoglobin: 14.1 g/dL (ref 13.0–17.0)
MCH: 32 pg (ref 26.0–34.0)
MCHC: 35.4 g/dL (ref 30.0–36.0)
MCV: 90.2 fL (ref 78.0–100.0)
PLATELETS: 221 10*3/uL (ref 150–400)
RBC: 4.41 MIL/uL (ref 4.22–5.81)
RDW: 13.2 % (ref 11.5–15.5)
WBC: 6.6 10*3/uL (ref 4.0–10.5)

## 2017-06-02 LAB — GLUCOSE, CAPILLARY
GLUCOSE-CAPILLARY: 148 mg/dL — AB (ref 65–99)
GLUCOSE-CAPILLARY: 114 mg/dL — AB (ref 65–99)
Glucose-Capillary: 77 mg/dL (ref 65–99)
Glucose-Capillary: 124 mg/dL — ABNORMAL HIGH (ref 65–99)
Glucose-Capillary: 89 mg/dL (ref 65–99)
Glucose-Capillary: 124 mg/dL — ABNORMAL HIGH (ref 65–99)

## 2017-06-02 LAB — TSH: TSH: 8.752 u[IU]/mL — ABNORMAL HIGH (ref 0.350–4.500)

## 2017-06-02 MED ORDER — HYDROMORPHONE HCL 1 MG/ML IJ SOLN
0.5000 mg | INTRAMUSCULAR | Status: DC | PRN
  Administered 2017-06-02 – 2017-06-05 (×14): 0.5 mg via INTRAVENOUS
  Filled 2017-06-02 (×14): qty 1

## 2017-06-02 MED ORDER — KETOROLAC TROMETHAMINE 30 MG/ML IJ SOLN
30.0000 mg | Freq: Four times a day (QID) | INTRAMUSCULAR | Status: AC | PRN
  Administered 2017-06-02: 30 mg via INTRAVENOUS
  Filled 2017-06-02: qty 1

## 2017-06-02 NOTE — Progress Notes (Signed)
PROGRESS NOTE    Gary Marsh  ZOX:[id number] DOB: 1951/02/08 DOA: 05/28/2017 PCP: Patient, No Pcp Per    Brief Narrative:  66 yo with prior hernia repair, admitted with several bouts of SBO that resolved non surgically in the past. 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 He is on no chronic narcotics. He has an NGT placed and surgery has been consulted. He has no other complaints. There is still discomfort, distention, and significant NGT return. KUB showed mild improvement. Surgery d/c NGT, increased ambulation, and continued with clear liquid.    Assessment & Plan:   Principal Problem:   SBO (small bowel obstruction) (HCC) Active Problems:   Hyperglycemia   SBO: Will continue with conservative Tx,  IVF, and symtomatic Tx. His lipase, LFT, CBC were unremarkable. KUB showed mild improvement, and NGT was d/c today.  We can't do PPN here, so either TPN or diet.  Will delay TPN until later this week in hope he will be able to take oral.  Will d/c narcotics.  Use IV Toradol x 24 hours.   DVT prophylaxis:SCD. Code Status:FULL CODE.  Family Communication:None.  Disposition Plan:Home.    Consultants:   Surgery.   Procedures:   None.   Antimicrobials: Anti-infectives    None       Subjective:  Occasional pain.   Objective: Vitals:   06/01/17 1448 06/01/17 2124 06/02/17 0450 06/02/17 1056  BP: 112/66 (!) 103/58 (!) 107/53   Pulse: (!) 54 62 62   Resp: 18 18 18    Temp: 98.2 F (36.8 C) 98.2 F (36.8 C) 98.4 F (36.9 C)   TempSrc: Oral Oral Oral   SpO2: 94% 95% 95% 94%  Weight:      Height:        Intake/Output Summary (Last 24 hours) at 06/02/17 1257 Last data filed at 06/02/17 0900  Gross per 24 hour  Intake          2676.25 ml  Output             4052 ml  Net         -1375.75 ml   Filed Weights   05/28/17 1330 05/28/17 1848  Weight: 100.7 kg (222 lb) 98.5 kg (217 lb 2.5 oz)     Examination:  General exam: Appears calm and comfortable  Respiratory system: Clear to auscultation. Respiratory effort normal. Cardiovascular system: S1 & S2 heard, RRR. No JVD, murmurs, rubs, gallops or clicks. No pedal edema. Gastrointestinal system: Abdomen is nondistended, soft and nontender. No organomegaly or masses felt. Normal bowel sounds heard. Central nervous system: Alert and oriented. No focal neurological deficits. Extremities: Symmetric 5 x 5 power. Skin: No rashes, lesions or ulcers Psychiatry: Judgement and insight appear normal. Mood & affect appropriate.   Data Reviewed: I have personally reviewed following labs and imaging studies  CBC:  Recent Labs Lab 05/28/17 1351 05/29/17 0554 06/02/17 0619  WBC 8.3 8.1 6.6  HGB 16.6 15.3 14.1  HCT 46.9 44.6 39.8  MCV 92.0 92.7 90.2  PLT 232 215 221   Basic Metabolic Panel:  Recent Labs Lab 05/28/17 1351 05/29/17 0554 06/02/17 0619  NA 135 138 138  K 3.8 3.5 3.2*  CL 101 104 106  CO2 24 25 25   GLUCOSE 164* 109* 122*  BUN 7 10 5*  CREATININE 0.99 0.93 0.80  CALCIUM 9.1 8.4* 8.5*   Liver Function Tests:  Recent Labs Lab 05/28/17  1351 06/02/17 0619  AST 27 51*  ALT 33 43  ALKPHOS 80 65  BILITOT 0.9 1.0  PROT 7.7 6.4*  ALBUMIN 4.0 3.4*    Recent Labs Lab 05/28/17 1351  LIPASE 25   CBG:  Recent Labs Lab 06/01/17 1939 06/01/17 2331 06/02/17 0453 06/02/17 0817 06/02/17 1147  GLUCAP 147* 105* 124* 148* 114*   Thyroid Function Tests:  Recent Labs  06/02/17 0619  TSH 8.752*   Anemia Panel:  Radiology Studies: No results found.  Scheduled Meds: . insulin aspart  0-9 Units Subcutaneous Q4H  . pantoprazole  40 mg Oral Daily  . pregabalin  150 mg Oral TID   Continuous Infusions: . dextrose 5 % and 0.9 % NaCl with KCl 20 mEq/L 125 mL/hr at 06/02/17 0407     LOS: 4 days   Lalisa Kiehn, MD [facility]ics[facility]ACP Hospitalist.   If 7PM-7AM, please contact night-coverage [email or web address] Password  [email or web address] 06/02/2017, 12:57 PM

## 2017-06-02 NOTE — Progress Notes (Signed)
SURGICAL PROGRESS NOTE (cpt [phone])  Hospital Day(s): 4.   Post op day(s):  Gary Marsh   Interval History: Patient seen and examined, no acute events or new complaints overnight. Patient reports tolerating the entire tray of clear liquids diet and consistently passing +flatus yesterday and today, denies any acutely worsened pain or N/V since NG tube clamped overnight, though persistent mild intermittent crampy abdominal pain. Patient otherwise denies any fever/chills, CP, or SOB, and reports he has been ambulating in the halls without difficulty.  Review of Systems:  Constitutional: denies fever, chills  HEENT: denies cough or congestion  Respiratory: denies any shortness of breath  Cardiovascular: denies chest pain or palpitations  Gastrointestinal: abdominal pain, N/V, and bowel function as per interval history Genitourinary: denies burning with urination or urinary frequency Musculoskeletal: denies pain, decreased motor or sensation Integumentary: denies any other rashes or skin discolorations Neurological: denies HA or vision/hearing changes   Vital signs in last 24 hours: [min-max] current  Temp:  [98.2 F (36.8 C)-98.4 F (36.9 C)] 98.4 F (36.9 C) (08/06 0450) Pulse Rate:  [54-62] 62 (08/06 0450) Resp:  [18] 18 (08/06 0450) BP: (103-112)/(53-66) 107/53 (08/06 0450) SpO2:  [94 %-95 %] 95 % (08/06 0450)     Height: 5\' 10"  (177.8 cm) Weight: 217 lb 2.5 oz (98.5 kg) BMI (Calculated): 31.2   Intake/Output this shift:  No intake/output data recorded.   Intake/Output last 2 shifts:  @IOLAST2SHIFTS @   Physical Exam:  Constitutional: alert, cooperative and no distress  HENT: normocephalic without obvious abnormality  Eyes: PERRL, EOM's grossly intact and symmetric  Neuro: CN II - XII grossly intact and symmetric without deficit  Respiratory: breathing non-labored at rest  Cardiovascular: regular rate and sinus rhythm  Gastrointestinal: soft, minimal upper abdominal tenderness to  deep palpation overlying stomach region with NG tube in place (clamped), overweight but completely non-distended from patient's reported baseline Musculoskeletal: UE and LE FROM, no edema or wounds, motor and sensation grossly intact, NT   Labs:  CBC Latest Ref Rng & Units 06/02/2017 05/29/2017 05/28/2017  WBC 4.0 - 10.5 K/uL 6.6 8.1 8.3  Hemoglobin 13.0 - 17.0 g/dL 27.2 53.6 64.4  Hematocrit 39.0 - 52.0 % 39.8 44.6 46.9  Platelets 150 - 400 K/uL 221 215 232   CMP Latest Ref Rng & Units 06/02/2017 05/29/2017 05/28/2017  Glucose 65 - 99 mg/dL 034(V) 425(Z) 563(O)  BUN 6 - 20 mg/dL 5(L) 10 7  Creatinine 7.56 - 1.24 mg/dL 4.33 2.95 1.88  Sodium 135 - 145 mmol/L 138 138 135  Potassium 3.5 - 5.1 mmol/L 3.2(L) 3.5 3.8  Chloride 101 - 111 mmol/L 106 104 101  CO2 22 - 32 mmol/L 25 25 24   Calcium 8.9 - 10.3 mg/dL 4.1(Y) 6.0(Y) 9.1  Total Protein 6.5 - 8.1 g/dL 6.4(L) - 7.7  Total Bilirubin 0.3 - 1.2 mg/dL 1.0 - 0.9  Alkaline Phos 38 - 126 U/L 65 - 80  AST 15 - 41 U/L 51(H) - 27  ALT 17 - 63 U/L 43 - 33   Imaging studies: No new pertinent imaging studies   Assessment/Plan: (ICD-10's: K54.52) 66 y.o. 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   - discontinue narcotic pain medications  - continue to monitor abdominal exam and bowel function  -  remove NG tube since +flatus and tolerating CLD with NG tube clamped  - will continue just clear liquids for now with hope to advance diet tomorrow considering mixed clinical improvement (still having some crampy abdominal pain)  - medical management of comorbidities per primary medical team  - ambulation encouraged, DVT prophylaxis  All of the above  findings and recommendations were discussed with the patient and the medical team (Dr. Conley RollsLe present at patient's bedside), and all of patient's questions were answered to his expressed satisfaction.  Thank you for the opportunity to participate in this patient's care.  -- Scherrie GerlachJason E. Earlene Plateravis, MD, RPVI [place]: [facility] - Partnering for exceptional care. Office: [phone]

## 2017-06-03 DIAGNOSIS — R1033 Periumbilical pain: Secondary | ICD-10-CM

## 2017-06-03 LAB — GLUCOSE, CAPILLARY
Glucose-Capillary: 103 mg/dL — ABNORMAL HIGH (ref 65–99)
Glucose-Capillary: 108 mg/dL — ABNORMAL HIGH (ref 65–99)
Glucose-Capillary: 113 mg/dL — ABNORMAL HIGH (ref 65–99)
Glucose-Capillary: 118 mg/dL — ABNORMAL HIGH (ref 65–99)
Glucose-Capillary: 123 mg/dL — ABNORMAL HIGH (ref 65–99)
Glucose-Capillary: 123 mg/dL — ABNORMAL HIGH (ref 65–99)

## 2017-06-03 LAB — T4, FREE: Free T4: 1.01 ng/dL (ref 0.61–1.12)

## 2017-06-03 MED ORDER — KCL IN DEXTROSE-NACL 20-5-0.9 MEQ/L-%-% IV SOLN
INTRAVENOUS | Status: DC
  Administered 2017-06-03 – 2017-06-05 (×4): via INTRAVENOUS

## 2017-06-03 MED ORDER — BISACODYL 5 MG PO TBEC
5.0000 mg | DELAYED_RELEASE_TABLET | Freq: Every day | ORAL | Status: DC | PRN
  Administered 2017-06-03: 5 mg via ORAL
  Filled 2017-06-03: qty 1

## 2017-06-03 MED ORDER — LACTULOSE 10 GM/15ML PO SOLN
10.0000 g | Freq: Two times a day (BID) | ORAL | Status: DC | PRN
  Administered 2017-06-03: 10 g via ORAL
  Filled 2017-06-03: qty 30

## 2017-06-03 MED ORDER — FAT EMULSION 20 % IV EMUL
250.0000 mL | INTRAVENOUS | Status: DC
  Administered 2017-06-03: 250 mL via INTRAVENOUS
  Filled 2017-06-03: qty 250

## 2017-06-03 MED ORDER — TRACE MINERALS CR-CU-MN-SE-ZN 10-1000-500-60 MCG/ML IV SOLN
INTRAVENOUS | Status: DC
  Administered 2017-06-03: 17:00:00 via INTRAVENOUS
  Filled 2017-06-03: qty 1000

## 2017-06-03 MED ORDER — ZOLPIDEM TARTRATE 5 MG PO TABS
10.0000 mg | ORAL_TABLET | Freq: Every evening | ORAL | Status: DC | PRN
  Administered 2017-06-03: 10 mg via ORAL
  Filled 2017-06-03: qty 2

## 2017-06-03 MED ORDER — TRACE MINERALS CR-CU-MN-SE-ZN 10-1000-500-60 MCG/ML IV SOLN: INTRAVENOUS | Status: DC

## 2017-06-03 MED ORDER — INSULIN ASPART 100 UNIT/ML ~~LOC~~ SOLN
0.0000 [IU] | SUBCUTANEOUS | Status: DC
  Administered 2017-06-04 (×3): 2 [IU] via SUBCUTANEOUS

## 2017-06-03 NOTE — Progress Notes (Signed)
Pt states that if he could just have a large BM he thinks he would be ok. Sticky note left for Dr Conley RollsLe.

## 2017-06-03 NOTE — Care Management Note (Signed)
Case Management Note  Patient Details  Name: Gary Marsh MRN: [id number] Date of Birth: 15-Aug-1951   Status of Service:  In process, will continue to follow  If discussed at Long Length of Stay Meetings, dates discussed:  06/03/2017  Additional Comments:  Simisola Sandles, Chrystine OilerSharley Diane, RN 06/03/2017, 10:29 AM

## 2017-06-03 NOTE — Plan of Care (Signed)
Problem: Pain Managment: Goal: General experience of comfort will improve Outcome: Progressing Pt has c/o continuous abdominal pain x1 this shift and has received Dilaudid. Pt educated on how narcotic use can slow down the GI tract and he states he is trying not to use any narcotics if he doesn't have too. Pt alert and oriented, complains of tenderness in LLQ of abdomen without palpation. Will continue to monitor pt

## 2017-06-03 NOTE — Progress Notes (Signed)
SURGICAL PROGRESS NOTE (cpt [phone])  Hospital Day(s): 5.   Post op day(s):  [name].   Interval History: Patient seen and examined, reports he tolerated clear liquids with ongoing flatus, but continues to report intermittent crampy peri-umbilical (Left of umbilicus) pain overnight, followed by transient pain-associated nausea without emesis, for which patient stopped eating/drinking. Patient denies any belching with unchanged abdomen that patient described yesterday as non-distended, though his daughter reportedly commented appeared somewhat more distended than when she last saw him 2 days ago. Patient otherwise reports ambulation without difficulty, denies nausea when not experiencing pain, fever/chills, CP, or SOB.  Review of Systems:  Constitutional: denies fever, chills  HEENT: denies cough or congestion  Respiratory: denies any shortness of breath  Cardiovascular: denies chest pain or palpitations  Gastrointestinal: abdominal pain, N/V, and bowel function as per interval history Genitourinary: denies burning with urination or urinary frequency Musculoskeletal: denies pain, decreased motor or sensation Integumentary: denies any other rashes or skin discolorations Neurological: denies HA or vision/hearing changes   Vital signs in last 24 hours: [min-max] current  Temp:  [98.2 F (36.8 C)-98.5 F (36.9 C)] 98.2 F (36.8 C) (08/07 0430) Pulse Rate:  [60-65] 60 (08/07 0430) Resp:  [16-20] 18 (08/07 0430) BP: (106-120)/(55-65) 120/65 (08/07 0430) SpO2:  [95 %-97 %] 95 % (08/07 0700) Weight:  [214 lb 1.6 oz (97.1 kg)] 214 lb 1.6 oz (97.1 kg) (08/07 1001)     Height: 5\' 10"  (177.8 cm) Weight: 214 lb 1.6 oz (97.1 kg) BMI (Calculated): 31.2   Intake/Output this shift:  No intake/output data recorded.   Intake/Output last 2 shifts:  @IOLAST2SHIFTS @   Physical Exam:  Constitutional: alert, cooperative and no distress  HENT: normocephalic without obvious abnormality  Eyes: PERRL, EOM's  grossly intact and symmetric  Neuro: CN II - XII grossly intact and symmetric without deficit  Respiratory: breathing non-labored at rest  Cardiovascular: regular rate and sinus rhythm  Gastrointestinal: soft and obese with mild-/moderate- Left of umbilicus and mild epigastric tenderness to palpation and abdominal girth unchanged from yesterday, when patient stated his abdomen was normal for him, no guarding or rebound tenderness  Musculoskeletal: UE and LE FROM, motor and sensation grossly intact, NT   Labs:  CBC Latest Ref Rng & Units 06/02/2017 05/29/2017 05/28/2017  WBC 4.0 - 10.5 K/uL 6.6 8.1 8.3  Hemoglobin 13.0 - 17.0 g/dL 40.914.1 81.115.3 91.416.6  Hematocrit 39.0 - 52.0 % 39.8 44.6 46.9  Platelets 150 - 400 K/uL 221 215 232   CMP Latest Ref Rng & Units 06/02/2017 05/29/2017 05/28/2017  Glucose 65 - 99 mg/dL 782(N122(H) 562(Z109(H) 308(M164(H)  BUN 6 - 20 mg/dL 5(L) 10 7  Creatinine 5.780.61 - 1.24 mg/dL 4.690.80 6.290.93 5.280.99  Sodium 135 - 145 mmol/L 138 138 135  Potassium 3.5 - 5.1 mmol/L 3.2(L) 3.5 3.8  Chloride 101 - 111 mmol/L 106 104 101  CO2 22 - 32 mmol/L 25 25 24   Calcium 8.9 - 10.3 mg/dL 4.1(L8.5(L) 2.4(M8.4(L) 9.1  Total Protein 6.5 - 8.1 g/dL 6.4(L) - 7.7  Total Bilirubin 0.3 - 1.2 mg/dL 1.0 - 0.9  Alkaline Phos 38 - 126 U/L 65 - 80  AST 15 - 41 U/L 51(H) - 27  ALT 17 - 63 U/L 43 - 33   Imaging studies: No new pertinent imaging studies   Assessment/Plan: (ICD-10's: 1K56.52) 66 y.o. 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              - IVF/TPN + correct electrolytes             -  continue to monitor abdominal exam and bowel function             - medical management of comorbidities per primary medical team  - will review recent CT to reassess SBO and small fat-containing umbilical hernia  - patient may still require surgical intervention, though unclear at this time  - check morning KUB if patient remains symptomatic despite +flatus  - NG tube if patient vomits or if abdominal pain/nausea worsens             - ambulation encouraged, DVT prophylaxis  All of the above findings and recommendations were discussed with the patient and the medical team (Dr. Conley Rolls present at patient's bedside), and all of patient's questions were answered to his expressed satisfaction.  Thank you for the opportunity to participate in this patient's care.  -- Scherrie Gerlach Earlene Plater, MD, RPVI [place]: [facility] - Partnering for exceptional care. Office: [phone]

## 2017-06-03 NOTE — Progress Notes (Signed)
Initial Nutrition Assessment  DOCUMENTATION CODES:   Severe malnutrition in context of acute illness/injury, Obesity unspecified  INTERVENTION:  TPN per phamacy   NUTRITION DIAGNOSIS:   Malnutrition (Severe ) related to acute illness (SBO) as evidenced by energy intake < or equal to 50% for > or equal to 5 days, percent weight loss 3.7% x 10 d.   GOAL:   Patient will meet greater than or equal to 90% of their needs   MONITOR:   Supplement acceptance  REASON FOR ASSESSMENT:   Consult New TPN/TNA  ASSESSMENT: [name] has a hx of recurrent SBO which in the past have resolved without surgical intervention. His NGT has been removed. Patient unable to take sufficient oral intake for ~ 2 weeks.   TPN is being initiated: Clinimix 5/15 @ 40 ml/hr and Lipids (20%) 20 ml x 12 hr.  Provides total of 1162 kcal, and 48 gr protein. MVI and trace elements supplemented by TPN.   Weight hx: pt weight has decreased significantly over the past 10 days 3.7%.  Nutrition-Focused physical exam completed; WDL    Recent Labs Lab 05/28/17 1351 05/29/17 0554 06/02/17 0619  NA 135 138 138  K 3.8 3.5 3.2*  CL 101 104 106  CO2 24 25 25   BUN 7 10 5*  CREATININE 0.99 0.93 0.80  CALCIUM 9.1 8.4* 8.5*  GLUCOSE 164* 109* 122*   Labs and Meds reviewed.   Diet Order:  Diet clear liquid Room service appropriate? Yes; Fluid consistency: Thin TPN (CLINIMIX-E) Adult Diet NPO time specified  Skin:  Reviewed, no issues  Last BM:  8/5 laxiative today, abdominal distention  Height:   Ht Readings from Last 1 Encounters:  05/28/17 5\' 10"  (1.778 m)    Weight:   Wt Readings from Last 1 Encounters:  06/03/17 214 lb 1.6 oz (97.1 kg)    Ideal Body Weight:  75 kg  BMI:  Body mass index is 30.72 kg/m.  Estimated Nutritional Needs:   Kcal:  2125 (MSJ x 1.2 AF)  Protein:  105-113 gr   Fluid:  2.1 liters daily  EDUCATION NEEDS:   No education needs identified at this time  Gary ShiversLynn  Josephyne Tarter MS,RD,CSG,LDN Office: #[phone] Pager: [phone]

## 2017-06-03 NOTE — Progress Notes (Addendum)
PHARMACY - ADULT TOTAL PARENTERAL NUTRITION CONSULT NOTE   Pharmacy Consult for TPN Indication: SBO  Patient Measurements: Height: 5\' 10"  (177.8 cm) Weight: 214 lb 1.6 oz (97.1 kg) IBW/kg (Calculated) : 73 TPN AdjBW (KG): 79.4 Body mass index is 30.72 kg/m. Usual Weight:   Assessment:  Okay for Protocol   GI: SBO, no nutrition for Approx 14 days. Endo: On SSI, fair CBG control Insulin requirements in the past 24 hours: 2 units Lytes: OK, recent low K+ Renal: Good  Best Practices: PO PPI TPN Access: 8/7 - central line ordered, placement confirmed. TPN start date: 8/7  Nutritional Goals (per RD recommendation on pending): KCal: 2400 Protein: 119  Current Nutrition: Clear Liquid (0% intake)  Plan:  Begin Clinimix E 5/15 at 40 ml/hr Begin 20% lipid emulsion at 20 ml/hr x 12 hours. Current IVMF (D5NS w. KCL 8520meq/L) at 125 ml/hr, will reduce to 85 ml/hr when TPN initiated.  This provides 48 g of protein and 1162 kCals per day meeting 40% of protein and 48% of kCal needs Add MVI and trace elements in TPN Currently zero units of regular insulin in TPN 8/7 resistant SSI ordered per MD and adjust as needed Monitor TPN labs, Fluid Status F/U AM labs.  Lamonte RicherHayes, Advaith Lamarque R 06/03/2017,11:13 AM

## 2017-06-03 NOTE — Progress Notes (Signed)
PROGRESS NOTE    Eppie GibsonClayburn Cuartas  ZOX:[id number] DOB: 09/11/1951 DOA: 05/28/2017 PCP: Patient, No Pcp Per    Brief Narrative:  66 yo with prior hernia repair, admitted with several bouts of SBO that resolved non surgically in the past. 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 He is on no chronic narcotics. He has an NGT placed and surgery has been consulted. He has no other complaints. There is still discomfort, distention, and significant NGT return. KUB showed mild improvement. Surgery d/c NGT, increased ambulation, and continued with clear liquid.  He hadn't been able to keep up with nutrition for close to 2 weeks now.    Assessment & Plan:   Principal Problem:   SBO (small bowel obstruction) (HCC) Active Problems:   Hyperglycemia  SBO:  Will continue with conservative Tx,  IVF, and symtomatic Tx. His lipase, LFT, CBC were unremarkable. KUB showed mild improvement, and NGT was d/c .  We can't do PPN here, so either TPN or diet.  Will start TPN today.  PICC line requested.  Minimize narcotics, and give laxatives.  Surgery following.  Elevated TSH:  Modest rise.  Will check free T4.   DVT prophylaxis: SCD Code Status: FULL CODE.  Family Communication: None.  Disposition Plan: Home.  Consultants:   Surgery.   Procedures:   None.   Antimicrobials: Anti-infectives    None       Subjective: " my stomach still hurts"   Objective: Vitals:   06/02/17 2128 06/03/17 0430 06/03/17 0700 06/03/17 1001  BP: (!) 106/55 120/65    Pulse: 65 60    Resp: 20 18    Temp: 98.5 F (36.9 C) 98.2 F (36.8 C)    TempSrc: Oral Oral    SpO2: 97% 97% 95%   Weight:    97.1 kg (214 lb 1.6 oz)  Height:        Intake/Output Summary (Last 24 hours) at 06/03/17 1047 Last data filed at 06/03/17 0316  Gross per 24 hour  Intake          3398.33 ml  Output                0 ml  Net          3398.33 ml   Filed Weights   05/28/17 1330 05/28/17 1848 06/03/17 1001  Weight: 100.7 kg (222 lb) 98.5 kg (217 lb 2.5 oz) 97.1 kg (214 lb 1.6 oz)    Examination:  General exam: Appears calm and comfortable  Respiratory system: Clear to auscultation. Respiratory effort normal. Cardiovascular system: S1 & S2 heard, RRR. No JVD, murmurs, rubs, gallops or clicks. No pedal edema. Gastrointestinal system: Abdomen is nondistended, soft and tender with hypoactive BS>  No organomegaly or masses felt. Normal bowel sounds heard. Central nervous system: Alert and oriented. No focal neurological deficits. Extremities: Symmetric 5 x 5 power. Skin: No rashes, lesions or ulcers Psychiatry: Judgement and insight appear normal. Mood & affect appropriate.   Data Reviewed: I have personally reviewed following labs and imaging studies  CBC:  Recent Labs Lab 05/28/17 1351 05/29/17 0554 06/02/17 0619  WBC 8.3 8.1 6.6  HGB 16.6 15.3 14.1  HCT 46.9 44.6 39.8  MCV 92.0 92.7 90.2  PLT 232 215 221   Basic Metabolic Panel:  Recent Labs Lab 05/28/17 1351 05/29/17 0554 06/02/17 0619  NA 135 138 138  K 3.8 3.5 3.2*  CL 101 104  106  CO2 24 25 25   GLUCOSE 164* 109* 122*  BUN 7 10 5*  CREATININE 0.99 0.93 0.80  CALCIUM 9.1 8.4* 8.5*   GFR: Estimated Creatinine Clearance: 109 mL/min (by C-G formula based on SCr of 0.8 mg/dL). Liver Function Tests:  Recent Labs Lab 05/28/17 1351 06/02/17 0619  AST 27 51*  ALT 33 43  ALKPHOS 80 65  BILITOT 0.9 1.0  PROT 7.7 6.4*  ALBUMIN 4.0 3.4*    Recent Labs Lab 05/28/17 1351  LIPASE 25   CBG:  Recent Labs Lab 06/02/17 1652 06/02/17 2034 06/03/17 0022 06/03/17 0429 06/03/17 0732  GLUCAP 89 124* 123* 123* 118*   Thyroid Function Tests:  Recent Labs  06/02/17 0619  TSH 8.752*    Radiology Studies: No results found.  Scheduled Meds: . insulin aspart  0-15 Units Subcutaneous Q4H  . pantoprazole  40 mg Oral Daily  . pregabalin  150 mg Oral TID   Continuous  Infusions: . dextrose 5 % and 0.9 % NaCl with KCl 20 mEq/L 125 mL/hr at 06/03/17 0435     LOS: 5 days   Lasonya Hubner, MD [facility].   If 7PM-7AM, please contact night-coverage [email or web address] Password [facility] 06/03/2017, 10:47 AM

## 2017-06-04 ENCOUNTER — Inpatient Hospital Stay (HOSPITAL_COMMUNITY): Payer: Medicare Other

## 2017-06-04 DIAGNOSIS — K56609 Unspecified intestinal obstruction, unspecified as to partial versus complete obstruction: Secondary | ICD-10-CM

## 2017-06-04 LAB — CBC
HEMATOCRIT: 41.3 % (ref 39.0–52.0)
Hemoglobin: 14.3 g/dL (ref 13.0–17.0)
MCH: 31.6 pg (ref 26.0–34.0)
MCHC: 34.6 g/dL (ref 30.0–36.0)
MCV: 91.2 fL (ref 78.0–100.0)
PLATELETS: 209 10*3/uL (ref 150–400)
RBC: 4.53 MIL/uL (ref 4.22–5.81)
RDW: 13.5 % (ref 11.5–15.5)
WBC: 8 10*3/uL (ref 4.0–10.5)

## 2017-06-04 LAB — GLUCOSE, CAPILLARY
GLUCOSE-CAPILLARY: 120 mg/dL — AB (ref 65–99)
GLUCOSE-CAPILLARY: 109 mg/dL — AB (ref 65–99)
GLUCOSE-CAPILLARY: 117 mg/dL — AB (ref 65–99)
Glucose-Capillary: 131 mg/dL — ABNORMAL HIGH (ref 65–99)
Glucose-Capillary: 145 mg/dL — ABNORMAL HIGH (ref 65–99)
Glucose-Capillary: 139 mg/dL — ABNORMAL HIGH (ref 65–99)

## 2017-06-04 LAB — COMPREHENSIVE METABOLIC PANEL
ALK PHOS: 69 U/L (ref 38–126)
ALT: 56 U/L (ref 17–63)
AST: 34 U/L (ref 15–41)
Albumin: 3.4 g/dL — ABNORMAL LOW (ref 3.5–5.0)
Anion gap: 7 (ref 5–15)
CALCIUM: 8.6 mg/dL — AB (ref 8.9–10.3)
CO2: 26 mmol/L (ref 22–32)
CREATININE: 0.78 mg/dL (ref 0.61–1.24)
Chloride: 104 mmol/L (ref 101–111)
Glucose, Bld: 139 mg/dL — ABNORMAL HIGH (ref 65–99)
Potassium: 3.4 mmol/L — ABNORMAL LOW (ref 3.5–5.1)
SODIUM: 137 mmol/L (ref 135–145)
Total Bilirubin: 0.8 mg/dL (ref 0.3–1.2)
Total Protein: 6.3 g/dL — ABNORMAL LOW (ref 6.5–8.1)

## 2017-06-04 LAB — DIFFERENTIAL
Basophils Absolute: 0 10*3/uL (ref 0.0–0.1)
Basophils Relative: 0 %
EOS PCT: 4 %
Eosinophils Absolute: 0.3 10*3/uL (ref 0.0–0.7)
LYMPHS ABS: 2 10*3/uL (ref 0.7–4.0)
Lymphocytes Relative: 25 %
MONO ABS: 0.5 10*3/uL (ref 0.1–1.0)
MONOS PCT: 6 %
Neutro Abs: 5.2 10*3/uL (ref 1.7–7.7)
Neutrophils Relative %: 65 %

## 2017-06-04 LAB — PHOSPHORUS: Phosphorus: 2.1 mg/dL — ABNORMAL LOW (ref 2.5–4.6)

## 2017-06-04 LAB — TRIGLYCERIDES: Triglycerides: 213 mg/dL — ABNORMAL HIGH (ref <150)

## 2017-06-04 LAB — MAGNESIUM: MAGNESIUM: 1.7 mg/dL (ref 1.7–2.4)

## 2017-06-04 LAB — PREALBUMIN: Prealbumin: 21.8 mg/dL (ref 18–38)

## 2017-06-04 NOTE — Plan of Care (Signed)
Problem: Pain Managment: Goal: General experience of comfort will improve Outcome: Progressing Pt has c/o abdominal pain x1 so far this shift. Dilaudid given x1. Pt again educated on pain mgmt as well as medications available. Pt verbalized understanding. Will continue to monitor pt

## 2017-06-04 NOTE — Care Management Important Message (Signed)
Important Message  Patient Details  Name: Gary Marsh MRN: [id number] Date of Birth: 04/28/1951   Medicare Important Message Given:  Yes    Izear Pine, Chrystine OilerSharley Diane, RN 06/04/2017, 2:10 PM

## 2017-06-04 NOTE — Progress Notes (Signed)
PROGRESS NOTE    Gary GibsonClayburn Marsh  ZOX:[id number] DOB: 1951-07-02 DOA: 05/28/2017 PCP: Patient, No Pcp Per     Brief Narrative:  66 year old man admitted to the hospital on 8/1 with small bowel traction. Had NG tube placed and surgery was consulted. KUB showed improvement, NG tube was subsequently discontinued as he was tolerating a clear liquid diet. TPN was started on 8/7.   Assessment & Plan:   Principal Problem:   SBO (small bowel obstruction) (HCC) Active Problems:   Hyperglycemia   Small bowel obstruction -Clinically resolved. -Has had multiple bowel movements overnight and was tolerating TPN without issues. -Plan to continue to advance diet. -Will discontinue TPN.   DVT prophylaxis: SCDs  Code Status: Full code  Family Communication: Patient only  Disposition Plan: Anticipate discharge home in 24 hours  Consultants:  Surgery, Dr. Earlene Plateravis  Procedures:   None Antimicrobials:  Anti-infectives    None       Subjective: Feels much improved, embarrassed about multiple stooling episodes overnight.  Objective: Vitals:   06/03/17 2029 06/03/17 2109 06/04/17 0522 06/04/17 1407  BP: (!) 116/58  (!) 120/59 125/69  Pulse: 61  66 79  Resp: 18  18 16   Temp: 99 F (37.2 C)  99.1 F (37.3 C) 98.6 F (37 C)  TempSrc: Oral  Oral Oral  SpO2: 95% 97% 96% 96%  Weight:   97.1 kg (214 lb 1.4 oz)   Height:        Intake/Output Summary (Last 24 hours) at 06/04/17 1826 Last data filed at 06/04/17 1800  Gross per 24 hour  Intake          3636.33 ml  Output             2751 ml  Net           885.33 ml   Filed Weights   05/28/17 1848 06/03/17 1001 06/04/17 0522  Weight: 98.5 kg (217 lb 2.5 oz) 97.1 kg (214 lb 1.6 oz) 97.1 kg (214 lb 1.4 oz)    Examination:  General exam: Alert, awake, oriented x 3 Respiratory system: Clear to auscultation. Respiratory effort normal. Cardiovascular system:RRR. No murmurs, rubs, gallops. Gastrointestinal system: Abdomen is  nondistended, soft and nontender. No organomegaly or masses felt. Normal bowel sounds heard. Central nervous system: Alert and oriented. No focal neurological deficits. Extremities: No C/C/E, +pedal pulses Skin: No rashes, lesions or ulcers Psychiatry: Judgement and insight appear normal. Mood & affect appropriate.     Data Reviewed: I have personally reviewed following labs and imaging studies  CBC:  Recent Labs Lab 05/29/17 0554 06/02/17 0619 06/04/17 0650  WBC 8.1 6.6 8.0  NEUTROABS  --   --  5.2  HGB 15.3 14.1 14.3  HCT 44.6 39.8 41.3  MCV 92.7 90.2 91.2  PLT 215 221 209   Basic Metabolic Panel:  Recent Labs Lab 05/29/17 0554 06/02/17 0619 06/04/17 0650  NA 138 138 137  K 3.5 3.2* 3.4*  CL 104 106 104  CO2 25 25 26   GLUCOSE 109* 122* 139*  BUN 10 5* <5*  CREATININE 0.93 0.80 0.78  CALCIUM 8.4* 8.5* 8.6*  MG  --   --  1.7  PHOS  --   --  2.1*   GFR: Estimated Creatinine Clearance: 109 mL/min (by C-G formula based on SCr of 0.78 mg/dL). Liver Function Tests:  Recent Labs Lab 06/02/17 0619 06/04/17 0650  AST 51* 34  ALT 43 56  ALKPHOS 65 69  BILITOT 1.0 0.8  PROT 6.4* 6.3*  ALBUMIN 3.4* 3.4*   No results for input(s): LIPASE, AMYLASE in the last 168 hours. No results for input(s): AMMONIA in the last 168 hours. Coagulation Profile: No results for input(s): INR, PROTIME in the last 168 hours. Cardiac Enzymes: No results for input(s): CKTOTAL, CKMB, CKMBINDEX, TROPONINI in the last 168 hours. BNP (last 3 results) No results for input(s): PROBNP in the last 8760 hours. HbA1C: No results for input(s): HGBA1C in the last 72 hours. CBG:  Recent Labs Lab 06/04/17 0100 06/04/17 0515 06/04/17 0743 06/04/17 1119 06/04/17 1617  GLUCAP 131* 145* 139* 120* 109*   Lipid Profile:  Recent Labs  06/04/17 0653  TRIG 213*   Thyroid Function Tests:  Recent Labs  06/02/17 0619 06/03/17 1120  TSH 8.752*  --   FREET4  --  1.01   Anemia  Panel: No results for input(s): VITAMINB12, FOLATE, FERRITIN, TIBC, IRON, RETICCTPCT in the last 72 hours. Urine analysis:    Component Value Date/Time   COLORURINE YELLOW 05/22/2017 1512   APPEARANCEUR HAZY (A) 05/22/2017 1512   LABSPEC 1.023 05/22/2017 1512   PHURINE 6.0 05/22/2017 1512   GLUCOSEU NEGATIVE 05/22/2017 1512   HGBUR NEGATIVE 05/22/2017 1512   BILIRUBINUR NEGATIVE 05/22/2017 1512   KETONESUR NEGATIVE 05/22/2017 1512   PROTEINUR 30 (A) 05/22/2017 1512   UROBILINOGEN 0.2 03/30/2010 1030   NITRITE NEGATIVE 05/22/2017 1512   LEUKOCYTESUR NEGATIVE 05/22/2017 1512   Sepsis Labs: @LABRCNTIP (procalcitonin:4,lacticidven:4)  )No results found for this or any previous visit (from the past 240 hour(s)).       Radiology Studies: Dg Abd 1 View  Result Date: 06/04/2017 CLINICAL DATA:  Partial small bowel obstruction EXAM: ABDOMEN - 1 VIEW COMPARISON:  05/31/2017; 05/28/2017; CT abdomen pelvis - 05/22/2017 FINDINGS: Paucity of bowel gas without evidence of enteric obstruction. Nondiagnostic evaluation for pneumoperitoneum secondary supine positioning and exclusion of the lower thorax. No pneumatosis or portal venous gas. No definitive intra-abdominal calcifications. No acute osseus abnormalities. IMPRESSION: Paucity of bowel gas without evidence of enteric obstruction. Electronically Signed   By: Simonne Come M.D.   On: 06/04/2017 07:37        Scheduled Meds: . insulin aspart  0-15 Units Subcutaneous Q4H  . pantoprazole  40 mg Oral Daily  . pregabalin  150 mg Oral TID   Continuous Infusions: . dextrose 5 % and 0.9 % NaCl with KCl 20 mEq/L 85 mL/hr at 06/04/17 1605     LOS: 6 days    Time spent: 30 minutes. Greater than 50% of this time was spent in direct contact with the patient coordinating care.     Chaya Jan, MD [facility] Pager [phone]  If 7PM-7AM, please contact night-coverage [email or web address] Password [facility] 06/04/2017, 6:26 PM

## 2017-06-04 NOTE — Progress Notes (Signed)
Late entry from 0200- Rn walked in room to check on pt and pt, IV was beeping and pt was not in bed. RN saw pt sitting on stool in room, incontinent episode of stool on floor and in trash can. Pt had also soiled his underwear. Pt stated he didn't know what happened, he was sleeping hard and couldn't wake up. Pt continues to be alert and oriented. Pt received Ambien 10mg  tonight per MD order.  Will inform day nurse to pass on.

## 2017-06-04 NOTE — Progress Notes (Signed)
SURGICAL PROGRESS NOTE (cpt [phone])  Hospital Day(s): 6.   Post op day(s):  [name]   Interval History: Patient seen and examined, overnight large BM noted. Patient reports ongoing and increased +flatus with decreased abdominal pain limited to peri-umbilical region, denies belching, N/V, CP, or SOB.  Review of Systems:  Constitutional: denies fever, chills  HEENT: denies cough or congestion  Respiratory: denies any shortness of breath  Cardiovascular: denies chest pain or palpitations  Gastrointestinal: abdominal pain, N/V, and bowel function as per interval history Genitourinary: denies burning with urination or urinary frequency Musculoskeletal: denies pain, decreased motor or sensation Integumentary: denies any other rashes or skin discolorations Neurological: denies HA or vision/hearing changes   Vital signs in last 24 hours: [min-max] current  Temp:  [97.8 F (36.6 C)-99.1 F (37.3 C)] 99.1 F (37.3 C) (08/08 0522) Pulse Rate:  [61-95] 66 (08/08 0522) Resp:  [18] 18 (08/08 0522) BP: (115-120)/(58-71) 120/59 (08/08 0522) SpO2:  [95 %-97 %] 96 % (08/08 0522) Weight:  [214 lb 1.4 oz (97.1 kg)] 214 lb 1.4 oz (97.1 kg) (08/08 0522)     Height: 5\' 10"  (177.8 cm) Weight: 214 lb 1.4 oz (97.1 kg) BMI (Calculated): 31.2   Intake/Output this shift:  Total I/O In: -  Out: 750 [Urine:750]   Intake/Output last 2 shifts:  @IOLAST2SHIFTS @   Physical Exam:  Constitutional: alert, cooperative and no distress  HENT: normocephalic without obvious abnormality  Eyes: PERRL, EOM's grossly intact and symmetric  Neuro: CN II - XII grossly intact and symmetric without deficit  Respiratory: breathing non-labored at rest  Cardiovascular: regular rate and sinus rhythm  Gastrointestinal: soft and overweight with mild focal peri-umbilical tenderness to palpation, unable to palpate recurrent fat-containing umbilical hernia well-visualized on CT imaging, otherwise non-tender and completely  non-distended (noticeably less than prior exams)  Musculoskeletal: UE and LE FROM, motor and sensation grossly intact, NT   Labs:  CBC Latest Ref Rng & Units 06/04/2017 06/02/2017 05/29/2017  WBC 4.0 - 10.5 K/uL 8.0 6.6 8.1  Hemoglobin 13.0 - 17.0 g/dL 19.1 47.8 29.5  Hematocrit 39.0 - 52.0 % 41.3 39.8 44.6  Platelets 150 - 400 K/uL 209 221 215   CMP Latest Ref Rng & Units 06/04/2017 06/02/2017 05/29/2017  Glucose 65 - 99 mg/dL 621(H) 086(V) 784(O)  BUN 6 - 20 mg/dL <9(G) 5(L) 10  Creatinine 0.61 - 1.24 mg/dL 2.95 2.84 1.32  Sodium 135 - 145 mmol/L 137 138 138  Potassium 3.5 - 5.1 mmol/L 3.4(L) 3.2(L) 3.5  Chloride 101 - 111 mmol/L 104 106 104  CO2 22 - 32 mmol/L 26 25 25   Calcium 8.9 - 10.3 mg/dL 4.4(W) 1.0(U) 7.2(Z)  Total Protein 6.5 - 8.1 g/dL 6.3(L) 6.4(L) -  Total Bilirubin 0.3 - 1.2 mg/dL 0.8 1.0 -  Alkaline Phos 38 - 126 U/L 69 65 -  AST 15 - 41 U/L 34 51(H) -  ALT 17 - 63 U/L 56 43 -   Imaging studies:  Abdominal KUB X-ray (06/04/2017) Paucity of bowel gas without evidence of enteric obstruction.   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  - advance to soft diet as tolerated  - no indication for surgical intervention at this time - continue to monitor abdominal exam and bowel function             -  outpatient follow-up to discuss repair of symptomatic recurrent umbilical hernia  - anticipate possibility of discharge tomorrow if continues to improve and tolerate PO - medical management of comorbidities per primary medical team - ambulation encouraged, DVT  prophylaxis  All of the above findings and recommendations were discussed with the patient and his medical physician (Dr. Ardyth HarpsHernandez), and all of patient's questions were answered to his expressed satisfaction.  Thank you for the opportunity to participate in this patient's care.  -- Scherrie GerlachJason E. Earlene Plateravis, MD, RPVI [place]: [facility] - Partnering for exceptional care. Office: [phone]

## 2017-06-05 LAB — GLUCOSE, CAPILLARY
GLUCOSE-CAPILLARY: 111 mg/dL — AB (ref 65–99)
GLUCOSE-CAPILLARY: 114 mg/dL — AB (ref 65–99)
GLUCOSE-CAPILLARY: 97 mg/dL (ref 65–99)
Glucose-Capillary: 117 mg/dL — ABNORMAL HIGH (ref 65–99)

## 2017-06-05 MED ORDER — PANTOPRAZOLE SODIUM 40 MG PO TBEC: 40.0000 mg | DELAYED_RELEASE_TABLET | Freq: Every day | ORAL | 0 refills | Status: AC

## 2017-06-05 MED ORDER — PREGABALIN 150 MG PO CAPS: 150.0000 mg | ORAL_CAPSULE | Freq: Three times a day (TID) | ORAL | 0 refills | Status: AC

## 2017-06-05 NOTE — Progress Notes (Signed)
Patient is to be discharged home and in stable condition. PICC line removed, WNL. Patient given discharge instructions and verbalized understanding. Patient walked out to transportation with staff present per request.  Quita SkyeMorgan P Dishmon, RN

## 2017-06-05 NOTE — Discharge Summary (Signed)
Physician Discharge Summary  Gary Marsh WNU:[id number] DOB: [birth year] DOA: 05/28/2017  PCP: Patient, No Pcp Per  Admit date: 05/28/2017 Discharge date: 06/05/2017  Time spent: 45 minutes  Recommendations for Outpatient Follow-up:  -Will be discharged home today. -Advised to follow up with Dr. Lovell Sheehan in 2 weeks.   Discharge Diagnoses:  Principal Problem:   SBO (small bowel obstruction) (HCC) Active Problems:   Hyperglycemia   Discharge Condition: Stable and improved  Filed Weights   06/03/17 1001 06/04/17 0522 06/05/17 0609  Weight: 97.1 kg (214 lb 1.6 oz) 97.1 kg (214 lb 1.4 oz) 97.1 kg (214 lb 1.1 oz)    History of present illness:  As per Dr. Ophelia Charter on 8/1:  Gary Marsh is a 66 y.o. male with medical history significant of GERD and back pain with several episodes of SBO presenting with abdominal pain, n/v.  Symptoms started last week.  He was seen on Wednesday (7/26) in the ER and diagnosed with mild ileus; he preferred to not be admitted and was discharged with pain and nausea medication and told to take liquids for 48 hours and f/u next week with GI.  He thought it was getting better, has been on liquids and jello since.  Last night about 2am it happened again.  This is the 4th recent episode, between here and [place].  He had a hernia surgery 2 years ago in [place] and has had episodic SBOs since.  Has not noticed blood in his vomit.  1 episode of emesis this AM, "mostly dry heaves; it was bad".  Had a regular BM about 2AM, the first one he has had in a week.   ED Course: SBO - plain films suggestive (CT scanner is down).  NG tube placed.  Hospital Course:   Small bowel obstruction -Clinically resolved. -Has had multiple bowel movements overnight and has been tolerating a solid diet without issues.  Procedures:  None   Consultations:  Surgery  Discharge Instructions  Discharge Instructions    Diet - low sodium heart healthy    Complete by:  As  directed    Increase activity slowly    Complete by:  As directed      Allergies as of 06/05/2017      Reactions   Wheat Bran Nausea Only   Carbamazepine Itching, Rash   Duloxetine Itching, Rash      Medication List    TAKE these medications   atenolol 25 MG tablet Commonly known as:  TENORMIN Take 25 mg by mouth as needed. Takes when needed, slows his heart rate down   furosemide 40 MG tablet Commonly known as:  LASIX Take 40 mg by mouth daily.   HYDROcodone-acetaminophen 5-325 MG tablet Commonly known as:  NORCO/VICODIN Take 1 tablet by mouth every 6 (six) hours as needed for moderate pain.   ondansetron 4 MG disintegrating tablet Commonly known as:  ZOFRAN ODT 4mg  ODT q4 hours prn nausea/vomit   pantoprazole 40 MG tablet Commonly known as:  PROTONIX Take 1 tablet (40 mg total) by mouth daily.   pregabalin 150 MG capsule Commonly known as:  LYRICA Take 1 capsule (150 mg total) by mouth 3 (three) times daily.      Allergies  Allergen Reactions  . Wheat Bran Nausea Only  . Carbamazepine Itching and Rash  . Duloxetine Itching and Rash      The results of significant diagnostics from this hospitalization (including imaging, microbiology, ancillary and laboratory) are listed below for reference.  Significant Diagnostic Studies: Dg Abd 1 View  Result Date: 06/04/2017 CLINICAL DATA:  Partial small bowel obstruction EXAM: ABDOMEN - 1 VIEW COMPARISON:  05/31/2017; 05/28/2017; CT abdomen pelvis - 05/22/2017 FINDINGS: Paucity of bowel gas without evidence of enteric obstruction. Nondiagnostic evaluation for pneumoperitoneum secondary supine positioning and exclusion of the lower thorax. No pneumatosis or portal venous gas. No definitive intra-abdominal calcifications. No acute osseus abnormalities. IMPRESSION: Paucity of bowel gas without evidence of enteric obstruction. Electronically Signed   By: Simonne Come M.D.   On: 06/04/2017 07:37   Ct Abdomen Pelvis W  Contrast  Result Date: 05/22/2017 CLINICAL DATA:  Abdomen pain a nausea since last night EXAM: CT ABDOMEN AND PELVIS WITH CONTRAST TECHNIQUE: Multidetector CT imaging of the abdomen and pelvis was performed using the standard protocol following bolus administration of intravenous contrast. CONTRAST:  ISOVUE-300 IOPAMIDOL (ISOVUE-300) INJECTION 61% COMPARISON:  December 22, 2012 FINDINGS: Lower chest: There is atelectasis of the posterior lung bases. There is no focal pneumonia. The heart size is upper limits of normal. Hepatobiliary: There is diffuse low density of the liver without vessel displacement. No focal liver lesion is identified. Patient status post prior cholecystectomy. Pancreas: Unremarkable. No pancreatic ductal dilatation or surrounding inflammatory changes. Spleen: Normal in size without focal abnormality. Adrenals/Urinary Tract: Adrenal glands are unremarkable. Kidneys are normal, without renal calculi, or hydronephrosis. There is a 2.5 cm simple cyst in the lower pole right kidney. Bladder is unremarkable. Stomach/Bowel: There is a small hiatal hernia. Stomach is otherwise within normal limits. Patient status post prior appendectomy. No evidence of bowel wall thickening, or inflammatory changes. There is a mild focal dilated small bowel loop in the right mid abdomen. Vascular/Lymphatic: Aortic atherosclerosis. No enlarged abdominal or pelvic lymph nodes. Reproductive: Prostate is unremarkable. Other: There is peri umbilical herniation of mesenteric fat. There is no ascites. Musculoskeletal: Degenerative joint changes of the lower lumbar spine are noted. IMPRESSION: Focal dilated small bowel loop in the right mid abdomen. This could be due to focal ileus. No evidence of diverticulitis. Status post prior appendectomy and cholecystectomy. Electronically Signed   By: Sherian Rein M.D.   On: 05/22/2017 19:42   Dg Chest Port 1 View  Result Date: 05/28/2017 CLINICAL DATA:  History small bowel  obstruction. Recurrent nausea and vomiting beginning yesterday. The patient reports abdominal pain and distension for the past week. The patient has undergone nasogastric tube placement. EXAM: PORTABLE CHEST 1 VIEW COMPARISON:  Chest x-ray of May 22, 2017 FINDINGS: The esophagogastric tube tip lies in the gastric cardia and the proximal port just above the GE junction. The lungs are hypoinflated but grossly clear. The cardiac silhouette is mildly enlarged. The central pulmonary vascularity is prominent. IMPRESSION: Advancement of the nasogastric tube by approximately 10 cm is recommended to assure that the proximal port as well as the tip remain below the GE junction. Mild cardiomegaly.  No acute cardiopulmonary abnormality. Electronically Signed   By: David  [place]az[place] M.D.   On: 05/28/2017 15:11   Dg Abd 2 Views  Result Date: 05/31/2017 CLINICAL DATA:  66 year old male with a history of small bowel obstruction EXAM: ABDOMEN - 2 VIEW COMPARISON:  Prior abdominal radiographs 05/28/2017. FINDINGS: A nasogastric tube is present. The tip of the tube is in the descending duodenum and the proximal side port is in the region of the pylorus. Improved bowel gas pattern. No dilated gas-filled loops of small bowel are evident. Gas is noted throughout the colon. Surgical clips in a right upper  quadrant consistent with prior cholecystectomy. Lower lumbar degenerative disc disease. IMPRESSION: 1. Improving bowel gas pattern. No evidence of residual small bowel obstruction. 2. The tip of the gastric tube is located within the descending duodenum. Electronically Signed   By: Malachy MoanHeath  McCullough M.D.   On: 05/31/2017 11:12   Dg Abdomen Acute W/chest  Result Date: 05/22/2017 CLINICAL DATA:  Center and lower Abd pain and nausea since last night. Reports history of 2 SBOs in the past (most recent in February of this yr) and this feels similar. Pt reports some watery stool today. EXAM: DG ABDOMEN ACUTE W/ 1V CHEST COMPARISON:   12/26/2012 FINDINGS: Heart size is normal. There are no focal consolidations or pleural effusions. Bibasilar scarring appears stable. Biapical pleuroparenchymal changes are chronic. There is no free intraperitoneal air beneath the diaphragm. Supine and erect views of the abdomen show mildly prominent loops of small bowel in the right lower quadrant consistent with focal ileus or early obstruction. Visualized large bowel loops are not obstructed. Degenerative changes are seen in the spine. IMPRESSION: 1.  No evidence for acute cardiopulmonary abnormality. 2. Mildly dilated loops of small bowel in the right central abdomen consistent with focal ileus or early small bowel obstruction. Electronically Signed   By: Norva PavlovElizabeth  Brown M.D.   On: 05/22/2017 15:57   Dg Abd Portable 1v  Result Date: 05/28/2017 CLINICAL DATA:  Nausea, vomiting. History of small bowel obstruction. EXAM: PORTABLE ABDOMEN - 1 VIEW COMPARISON:  05/22/2017 CT FINDINGS: NG tube tip is in the proximal stomach with the side port in the distal esophagus. Relative paucity of gas in the abdomen with gas in a mildly prominent central small bowel loop concerning for continued small bowel obstruction. No organomegaly or free air. No suspicious calcification. IMPRESSION: NG tube tip in the proximal stomach with the side port in the distal esophagus. Mildly prominent central abdominal small bowel loop concerning for continued small bowel obstruction. Electronically Signed   By: Charlett NoseKevin  Dover M.D.   On: 05/28/2017 15:11    Microbiology: No results found for this or any previous visit (from the past 240 hour(s)).   Labs: Basic Metabolic Panel:  Recent Labs Lab 06/02/17 0619 06/04/17 0650  NA 138 137  K 3.2* 3.4*  CL 106 104  CO2 25 26  GLUCOSE 122* 139*  BUN 5* <5*  CREATININE 0.80 0.78  CALCIUM 8.5* 8.6*  MG  --  1.7  PHOS  --  2.1*   Liver Function Tests:  Recent Labs Lab 06/02/17 0619 06/04/17 0650  AST 51* 34  ALT 43 56    ALKPHOS 65 69  BILITOT 1.0 0.8  PROT 6.4* 6.3*  ALBUMIN 3.4* 3.4*   No results for input(s): LIPASE, AMYLASE in the last 168 hours. No results for input(s): AMMONIA in the last 168 hours. CBC:  Recent Labs Lab 06/02/17 0619 06/04/17 0650  WBC 6.6 8.0  NEUTROABS  --  5.2  HGB 14.1 14.3  HCT 39.8 41.3  MCV 90.2 91.2  PLT 221 209   Cardiac Enzymes: No results for input(s): CKTOTAL, CKMB, CKMBINDEX, TROPONINI in the last 168 hours. BNP: BNP (last 3 results) No results for input(s): BNP in the last 8760 hours.  ProBNP (last 3 results) No results for input(s): PROBNP in the last 8760 hours.  CBG:  Recent Labs Lab 06/04/17 2007 06/05/17 0012 06/05/17 0354 06/05/17 0729 06/05/17 1126  GLUCAP 117* 97 111* 114* 117*       Signed:  HERNANDEZ ACOSTA,ESTELA  [facility] Pager: [phone] 06/05/2017, 4:07 PM

## 2017-06-05 NOTE — Care Management Note (Signed)
Case Management Note  Patient Details  Name: Gary Marsh MRN: [id number] Date of Birth: [birth year]   Status of Service:  In process, will continue to follow  If discussed at Long Length of Stay Meetings, dates discussed:  06/05/2017  Additional Comments:  Irvin Lizama, Chrystine OilerSharley Diane, RN 06/05/2017, 10:23 AM

## 2017-06-07 ENCOUNTER — Encounter (HOSPITAL_COMMUNITY): Payer: Self-pay

## 2017-06-07 DIAGNOSIS — R1033 Periumbilical pain: Secondary | ICD-10-CM | POA: Insufficient documentation

## 2017-06-07 DIAGNOSIS — R112 Nausea with vomiting, unspecified: Secondary | ICD-10-CM | POA: Diagnosis not present

## 2017-06-07 DIAGNOSIS — Z79899 Other long term (current) drug therapy: Secondary | ICD-10-CM | POA: Insufficient documentation

## 2017-06-07 LAB — LIPASE, BLOOD: Lipase: 57 U/L — ABNORMAL HIGH (ref 11–51)

## 2017-06-07 LAB — CBC
HEMATOCRIT: 45.5 % (ref 39.0–52.0)
HEMOGLOBIN: 16.4 g/dL (ref 13.0–17.0)
MCH: 32.5 pg (ref 26.0–34.0)
MCHC: 36 g/dL (ref 30.0–36.0)
MCV: 90.3 fL (ref 78.0–100.0)
RBC: 5.04 MIL/uL (ref 4.22–5.81)
RDW: 13.7 % (ref 11.5–15.5)
WBC: 8.4 10*3/uL (ref 4.0–10.5)

## 2017-06-07 LAB — URINALYSIS, ROUTINE W REFLEX MICROSCOPIC
BACTERIA UA: NONE SEEN
BILIRUBIN URINE: NEGATIVE
GLUCOSE, UA: NEGATIVE mg/dL
HGB URINE DIPSTICK: NEGATIVE
KETONES UR: NEGATIVE mg/dL
NITRITE: NEGATIVE
PROTEIN: NEGATIVE mg/dL
Specific Gravity, Urine: 1.021 (ref 1.005–1.030)
pH: 7 (ref 5.0–8.0)

## 2017-06-07 LAB — COMPREHENSIVE METABOLIC PANEL
ALBUMIN: 4 g/dL (ref 3.5–5.0)
ALK PHOS: 76 U/L (ref 38–126)
ALT: 32 U/L (ref 17–63)
ANION GAP: 9 (ref 5–15)
AST: 33 U/L (ref 15–41)
BILIRUBIN TOTAL: 1.1 mg/dL (ref 0.3–1.2)
BUN: 10 mg/dL (ref 6–20)
CALCIUM: 9 mg/dL (ref 8.9–10.3)
CO2: 24 mmol/L (ref 22–32)
Chloride: 107 mmol/L (ref 101–111)
Creatinine, Ser: 0.71 mg/dL (ref 0.61–1.24)
GFR calc non Af Amer: >60 mL/min (ref >60)
GLUCOSE: 113 mg/dL — AB (ref 65–99)
POTASSIUM: 4.8 mmol/L (ref 3.5–5.1)
SODIUM: 140 mmol/L (ref 135–145)
TOTAL PROTEIN: 7.5 g/dL (ref 6.5–8.1)

## 2017-06-07 NOTE — ED Triage Notes (Signed)
States for about 3 weeks now [facility] and discharged and [facility] and now here today for same problem of N/V/D and abdominal pain.

## 2017-06-08 ENCOUNTER — Encounter (HOSPITAL_COMMUNITY): Payer: Self-pay | Admitting: Radiology

## 2017-06-08 ENCOUNTER — Emergency Department (HOSPITAL_COMMUNITY): Payer: Medicare Other

## 2017-06-08 ENCOUNTER — Emergency Department (HOSPITAL_COMMUNITY)
Admission: EM | Admit: 2017-06-08 | Discharge: 2017-06-08 | Disposition: A | Discharge status: Home / Self Care | Payer: Medicare Other | Attending: Emergency Medicine | Admitting: Emergency Medicine

## 2017-06-08 DIAGNOSIS — R1033 Periumbilical pain: Secondary | ICD-10-CM

## 2017-06-08 DIAGNOSIS — R112 Nausea with vomiting, unspecified: Secondary | ICD-10-CM

## 2017-06-08 MED ORDER — IOPAMIDOL (ISOVUE-300) INJECTION 61%
30.0000 mL | Freq: Once | INTRAVENOUS | Status: AC | PRN
  Administered 2017-06-08: 30 mL via ORAL

## 2017-06-08 MED ORDER — IOPAMIDOL (ISOVUE-300) INJECTION 61%
INTRAVENOUS | Status: AC
  Filled 2017-06-08: qty 30

## 2017-06-08 MED ORDER — ONDANSETRON HCL 4 MG/2ML IJ SOLN
4.0000 mg | Freq: Once | INTRAMUSCULAR | Status: AC
  Administered 2017-06-08: 4 mg via INTRAVENOUS
  Filled 2017-06-08: qty 2

## 2017-06-08 MED ORDER — IOPAMIDOL (ISOVUE-300) INJECTION 61%
INTRAVENOUS | Status: AC
  Filled 2017-06-08: qty 100

## 2017-06-08 MED ORDER — IOPAMIDOL (ISOVUE-300) INJECTION 61%
100.0000 mL | Freq: Once | INTRAVENOUS | Status: AC | PRN
  Administered 2017-06-08: 100 mL via INTRAVENOUS

## 2017-06-08 MED ORDER — MORPHINE SULFATE (PF) 2 MG/ML IV SOLN
4.0000 mg | Freq: Once | INTRAVENOUS | Status: AC
  Administered 2017-06-08: 4 mg via INTRAVENOUS
  Filled 2017-06-08: qty 2

## 2017-06-08 MED ORDER — SODIUM CHLORIDE 0.9 % IJ SOLN
INTRAMUSCULAR | Status: AC
  Filled 2017-06-08: qty 50

## 2017-06-08 NOTE — Discharge Instructions (Signed)
You were seen today for nausea, vomiting, and abdominal pain. There is no evidence of bowel obstruction on exam. You do have a fat-containing hernia. Make sure to stay hydrated, keep with clear fluids today. Take your Zofran at home. If anything worsens including pain or vomiting, you should be reevaluated immediately.

## 2017-06-08 NOTE — ED Provider Notes (Signed)
[facility]-EMERGENCY DEPT Provider Note   CSN: [id number] Arrival date & time: 06/07/17  2119     History   Chief Complaint Chief Complaint  Patient presents with  . Abdominal Pain    HPI Gary Marsh is a 66 y.o. male.  HPI  This is a 66 year old male with a history of multiple abdominal surgeries including ventral hernia repair and recurrent bouts extraction who presents with abdominal pain and vomiting. Patient was just admitted to [facility] for over 1 week for small bowel obstruction. This was medically managed. He was tolerating fluids at discharge. Patient reports onset of vomiting which was nonbilious, nonbloody, yesterday. He also reports diffuse abdominal pain and "I feel like my hernia is popping in and out." He rates his pain 8 out of 10. He reports 3 week history of diarrhea. This is been ongoing. Denies any fevers, urinary symptoms. Daughter reports concerns for malnutrition as patient required TPN while in the hospital previously.  Past Medical History:  Diagnosis Date  . Chronic back pain   . DDD (degenerative disc disease)   . GERD (gastroesophageal reflux disease)   . MVP (mitral valve prolapse)   . S/P colonoscopy April 2010   left-sided diverticula, small rectal polyp  . S/P endoscopy April 2010   small antral erosion  . Urinary hesitancy    Hx    Patient Active Problem List   Diagnosis Date Noted  . SBO (small bowel obstruction) (HCC) 05/28/2017  . Hyperglycemia 05/28/2017  . Hemorrhoid 03/03/2011  . LLQ abdominal pain 03/03/2011  . CHEST PAIN UNSPECIFIED 10/16/2010  . MELENA 04/05/2009  . DIARRHEA 04/05/2009  . ABDOMINAL PAIN OTHER SPECIFIED SITE 04/05/2009  . GERD 02/03/2009  . DEGENERATIVE DISC DISEASE 02/03/2009  . BACK PAIN, CHRONIC 02/03/2009  . NAUSEA WITH VOMITING 02/03/2009  . URINARY HESITANCY 02/03/2009  . ABDOMINAL PAIN 02/03/2009  . MITRAL VALVE PROLAPSE, HX OF 02/03/2009    Past Surgical History:  Procedure Laterality  Date  . APPENDECTOMY    . back surgery X 7    . CHOLECYSTECTOMY    . COLONOSCOPY  02/20/2009   WUJ:WJXBJYRMR:Rectal polyp status post hot snare removal/ scattered left-sided diverticula  . ESOPHAGOGASTRODUODENOSCOPY  02/20/2009   RMR: Normal esophagus.  A small antral ulceration status post biopsy   . HERNIA REPAIR    . left inguinal hernia repair         Home Medications    Prior to Admission medications   Medication Sig Start Date End Date Taking? Authorizing Provider  atenolol (TENORMIN) 25 MG tablet Take 25 mg by mouth as needed (heart rate). Takes when needed, slows his heart rate down   Yes [provider]  furosemide (LASIX) 40 MG tablet Take 40 mg by mouth daily as needed for fluid or edema.    Yes [provider]  HYDROcodone-acetaminophen (NORCO/VICODIN) 5-325 MG tablet Take 1 tablet by mouth every 6 (six) hours as needed for moderate pain. 05/22/17  Yes Bethann BerkshireZammit, Joseph, MD  ondansetron (ZOFRAN ODT) 4 MG disintegrating tablet 4mg  ODT q4 hours prn nausea/vomit 05/22/17  Yes Bethann BerkshireZammit, Joseph, MD  pantoprazole (PROTONIX) 40 MG tablet Take 1 tablet (40 mg total) by mouth daily. 06/05/17  Yes Philip AspenHernandez Acosta, Limmie PatriciaEstela Y, MD  pregabalin (LYRICA) 150 MG capsule Take 1 capsule (150 mg total) by mouth 3 (three) times daily. 06/05/17  Yes Henderson CloudHernandez Acosta, Estela Y, MD    Family History Family History  Problem Relation Age of Onset  . Colon cancer Mother  deceased age 40    Social History Social History  Substance Use Topics  . Smoking status: Never Smoker  . Smokeless tobacco: Current User    Types: Chew  . Alcohol use No     Allergies   Wheat bran; Carbamazepine; and Duloxetine   Review of Systems Review of Systems  Constitutional: Negative for fever.  Respiratory: Negative for shortness of breath.   Cardiovascular: Negative for chest pain.  Gastrointestinal: Positive for abdominal pain, diarrhea, nausea and vomiting. Negative for blood in stool and  constipation.  Genitourinary: Negative for dysuria.  All other systems reviewed and are negative.    Physical Exam Updated Vital Signs BP 130/85   Pulse 73   Temp 98.6 F (37 C) (Oral)   Resp 18   Ht 5\' 10"  (1.778 m)   Wt 96.2 kg (212 lb)   SpO2 99%   BMI 30.42 kg/m   Physical Exam  Constitutional: He is oriented to person, place, and time. No distress.  HENT:  Head: Normocephalic and atraumatic.  Eyes: Pupils are equal, round, and reactive to light.  Cardiovascular: Normal rate, regular rhythm and normal heart sounds.   No murmur heard. Pulmonary/Chest: Effort normal and breath sounds normal. No respiratory distress. He has no wheezes.  Abdominal: Soft. He exhibits distension. There is tenderness. There is no rebound.  Mild distention, tenderness to palpation about the umbilicus, no palpable hernia or overlying skin changes, multiple abdominal scars, hyperactive bowel sounds  Musculoskeletal: He exhibits no edema.  Neurological: He is alert and oriented to person, place, and time.  Skin: Skin is warm and dry.  Psychiatric: He has a normal mood and affect.  Nursing note and vitals reviewed.    ED Treatments / Results  Labs (all labs ordered are listed, but only abnormal results are displayed) Labs Reviewed  LIPASE, BLOOD - Abnormal; Notable for the following:       Result Value   Lipase 57 (*)    All other components within normal limits  COMPREHENSIVE METABOLIC PANEL - Abnormal; Notable for the following:    Glucose, Bld 113 (*)    All other components within normal limits  URINALYSIS, ROUTINE W REFLEX MICROSCOPIC - Abnormal; Notable for the following:    Leukocytes, UA TRACE (*)    Squamous Epithelial / LPF 0-5 (*)    All other components within normal limits  CBC  CBC    EKG  EKG Interpretation None       Radiology Ct Abdomen Pelvis W Contrast  Result Date: 06/08/2017 CLINICAL DATA:  Intermittent acute onset of periumbilical abdominal pain, nausea,  vomiting and diarrhea. Recently diagnosed with small bowel obstruction. Initial encounter. EXAM: CT ABDOMEN AND PELVIS WITH CONTRAST TECHNIQUE: Multidetector CT imaging of the abdomen and pelvis was performed using the standard protocol following bolus administration of intravenous contrast. CONTRAST:  100 mL ISOVUE-300 IOPAMIDOL (ISOVUE-300) INJECTION 61% COMPARISON:  CT of the abdomen and pelvis from 05/22/2017 FINDINGS: Lower chest: The visualized lung bases are grossly clear. The visualized portions of the mediastinum are unremarkable. Hepatobiliary: The liver is unremarkable in appearance. The patient is status post cholecystectomy, with clips noted at the gallbladder fossa. The common bile duct remains normal in caliber. Pancreas: The pancreas is within normal limits. Spleen: The spleen is unremarkable in appearance. Adrenals/Urinary Tract: The adrenal glands are unremarkable in appearance Mild nonspecific perinephric stranding is noted bilaterally. Nonobstructing bilateral renal stones measure up to 3 mm in size. A small right renal cyst is noted.  There is no evidence of hydronephrosis. No obstructing ureteral stones are seen. Stomach/Bowel: The stomach is unremarkable in appearance. The small bowel is within normal limits. The patient is status post appendectomy. The colon is unremarkable in appearance. Vascular/Lymphatic: Minimal calcification is noted at the distal abdominal aorta. The inferior vena cava is grossly unremarkable. No retroperitoneal lymphadenopathy is seen. No pelvic sidewall lymphadenopathy is identified. Reproductive: The bladder is mildly distended and grossly unremarkable. The prostate remains normal in size. Other: A small anterior abdominal wall mesh is chronically displaced on the right, with mild underlying herniation of fat just superior to the umbilicus. Musculoskeletal: No acute osseous abnormalities are identified. Facet disease is noted along the lumbar spine. The visualized  musculature is unremarkable in appearance. IMPRESSION: 1. No acute abnormality seen to explain the patient's symptoms. 2. Nonobstructing bilateral renal stones measure up to 3 mm in size. Small right renal cyst. 3. Small anterior abdominal wall mesh is chronically displaced on the right, with mild underlying herniation of fat just superior to the umbilicus. Electronically Signed   By: Roanna Raider M.D.   On: 06/08/2017 04:45   Dg Abdomen Acute W/chest  Result Date: 06/08/2017 CLINICAL DATA:  Initial evaluation for acute left lower quadrant abdominal pain, nausea, vomiting. Recent SBO. EXAM: DG ABDOMEN ACUTE W/ 1V CHEST COMPARISON:  Prior radiograph from 06/04/2017. FINDINGS: Cardiac and mediastinal silhouettes within normal limits. Lungs normally inflated. Mild left basilar atelectasis. No other focal infiltrates. No pulmonary edema or pleural effusion. No pneumothorax. Bowel gas pattern within normal limits without evidence for obstruction or ileus. Prominent gas present within the distal colon/ rectum. No abnormal bowel wall thickening. No free air. No soft tissue mass or abnormal calcification. Cholecystectomy clips noted. No acute osseus abnormality. Degenerative changes noted with the lower lumbar spine. IMPRESSION: 1. Nonobstructive bowel gas pattern. Prominent gas within the distal colon/rectum. 2. Mild left basilar atelectasis. No other active cardiopulmonary disease. Electronically Signed   By: Rise Mu M.D.   On: 06/08/2017 01:31    Procedures Procedures (including critical care time)  Medications Ordered in ED Medications  iopamidol (ISOVUE-300) 61 % injection (not administered)  iopamidol (ISOVUE-300) 61 % injection (not administered)  sodium chloride 0.9 % injection (not administered)  morphine 2 MG/ML injection 4 mg (4 mg Intravenous Given 06/08/17 0332)  ondansetron (ZOFRAN) injection 4 mg (4 mg Intravenous Given 06/08/17 0332)  iopamidol (ISOVUE-300) 61 % injection 30 mL  (30 mLs Oral Contrast Given 06/08/17 0215)  iopamidol (ISOVUE-300) 61 % injection 100 mL (100 mLs Intravenous Contrast Given 06/08/17 0416)  ondansetron (ZOFRAN) injection 4 mg (4 mg Intravenous Given 06/08/17 0600)     Initial Impression / Assessment and Plan / ED Course  I have reviewed the triage vital signs and the nursing notes.  Pertinent labs & imaging results that were available during my care of the patient were reviewed by me and considered in my medical decision making (see chart for details).     Patient seen for abdominal pain and vomiting. History of multiple abdominal surgeries and small bowel obstructions. He is nontoxic-appearing on exam. Mildly distended with hyperactive bowel sounds and tenderness over prior hernia surgery. No palpable hernia. Lab work is largely reassuring. Initial x-ray without obstructive pattern. Given overall clinical concern for obstruction, repeat CT scan obtained as patient is also very tender over prior hernia sites. Patient was given pain and nausea medication. CT scan is negative for acute obstruction. He does have a fat-containing hernia. On recheck, he reports  persistent nausea. No vomiting. He was given Zofran and able to orally hydrate. Discussed with daughter making sure he stays hydrated. If he has any new or worsening symptoms he needs to be reevaluated.  After history, exam, and medical workup I feel the patient has been appropriately medically screened and is safe for discharge home. Pertinent diagnoses were discussed with the patient. Patient was given return precautions.   Final Clinical Impressions(s) / ED Diagnoses   Final diagnoses:  Periumbilical abdominal pain  Non-intractable vomiting with nausea, unspecified vomiting type    New Prescriptions New Prescriptions   No medications on file     Shon Baton, MD 06/08/17 [phone]

## 2017-06-19 DIAGNOSIS — K5652 Intestinal adhesions [bands] with complete obstruction: Secondary | ICD-10-CM

## 2017-09-22 ENCOUNTER — Emergency Department (HOSPITAL_COMMUNITY)
Admission: EM | Admit: 2017-09-22 | Discharge: 2017-09-22 | Disposition: A | Discharge status: Home / Self Care | Payer: Medicare Other | Attending: Emergency Medicine | Admitting: Emergency Medicine

## 2017-09-22 ENCOUNTER — Other Ambulatory Visit: Payer: Self-pay

## 2017-09-22 ENCOUNTER — Encounter (HOSPITAL_COMMUNITY): Payer: Self-pay | Admitting: Emergency Medicine

## 2017-09-22 ENCOUNTER — Emergency Department (HOSPITAL_COMMUNITY): Payer: Medicare Other

## 2017-09-22 DIAGNOSIS — R197 Diarrhea, unspecified: Secondary | ICD-10-CM | POA: Insufficient documentation

## 2017-09-22 DIAGNOSIS — R112 Nausea with vomiting, unspecified: Secondary | ICD-10-CM | POA: Diagnosis not present

## 2017-09-22 DIAGNOSIS — R1084 Generalized abdominal pain: Secondary | ICD-10-CM | POA: Diagnosis present

## 2017-09-22 DIAGNOSIS — R109 Unspecified abdominal pain: Secondary | ICD-10-CM

## 2017-09-22 DIAGNOSIS — Z79899 Other long term (current) drug therapy: Secondary | ICD-10-CM | POA: Diagnosis not present

## 2017-09-22 LAB — COMPREHENSIVE METABOLIC PANEL
ALBUMIN: 4 g/dL (ref 3.5–5.0)
ALT: 22 U/L (ref 17–63)
ANION GAP: 10 (ref 5–15)
AST: 24 U/L (ref 15–41)
Alkaline Phosphatase: 77 U/L (ref 38–126)
BUN: 14 mg/dL (ref 6–20)
CO2: 21 mmol/L — AB (ref 22–32)
Calcium: 8.6 mg/dL — ABNORMAL LOW (ref 8.9–10.3)
Chloride: 102 mmol/L (ref 101–111)
Creatinine, Ser: 0.9 mg/dL (ref 0.61–1.24)
GFR calc Af Amer: >60 mL/min (ref >60)
GFR calc non Af Amer: >60 mL/min (ref >60)
GLUCOSE: 137 mg/dL — AB (ref 65–99)
Potassium: 3.4 mmol/L — ABNORMAL LOW (ref 3.5–5.1)
SODIUM: 133 mmol/L — AB (ref 135–145)
TOTAL PROTEIN: 7.3 g/dL (ref 6.5–8.1)
Total Bilirubin: 1 mg/dL (ref 0.3–1.2)

## 2017-09-22 LAB — CBC WITH DIFFERENTIAL/PLATELET
BASOS PCT: 0 %
Basophils Absolute: 0 10*3/uL (ref 0.0–0.1)
EOS ABS: 0.3 10*3/uL (ref 0.0–0.7)
Eosinophils Relative: 3 %
HCT: 46.2 % (ref 39.0–52.0)
Hemoglobin: 15.7 g/dL (ref 13.0–17.0)
Lymphocytes Relative: 22 %
Lymphs Abs: 2 10*3/uL (ref 0.7–4.0)
MCH: 31.8 pg (ref 26.0–34.0)
MCHC: 34 g/dL (ref 30.0–36.0)
MCV: 93.7 fL (ref 78.0–100.0)
MONO ABS: 0.6 10*3/uL (ref 0.1–1.0)
MONOS PCT: 7 %
Neutro Abs: 6.3 10*3/uL (ref 1.7–7.7)
Neutrophils Relative %: 68 %
Platelets: 213 10*3/uL (ref 150–400)
RBC: 4.93 MIL/uL (ref 4.22–5.81)
RDW: 13.8 % (ref 11.5–15.5)
WBC: 9.3 10*3/uL (ref 4.0–10.5)

## 2017-09-22 LAB — LIPASE, BLOOD: Lipase: 30 U/L (ref 11–51)

## 2017-09-22 MED ORDER — ONDANSETRON HCL 4 MG/2ML IJ SOLN
4.0000 mg | Freq: Once | INTRAMUSCULAR | Status: AC
  Administered 2017-09-22: 4 mg via INTRAVENOUS
  Filled 2017-09-22: qty 2

## 2017-09-22 MED ORDER — IOPAMIDOL (ISOVUE-300) INJECTION 61%
100.0000 mL | Freq: Once | INTRAVENOUS | Status: AC | PRN
  Administered 2017-09-22: 100 mL via INTRAVENOUS

## 2017-09-22 MED ORDER — ONDANSETRON 4 MG PO TBDP: 4.0000 mg | ORAL_TABLET | Freq: Three times a day (TID) | ORAL | 0 refills | Status: AC | PRN

## 2017-09-22 MED ORDER — SODIUM CHLORIDE 0.9 % IV BOLUS (SEPSIS)
1000.0000 mL | Freq: Once | INTRAVENOUS | Status: AC
Start: 2017-09-22 — End: 2017-09-22
  Administered 2017-09-22: 1000 mL via INTRAVENOUS

## 2017-09-22 NOTE — ED Triage Notes (Signed)
PT c/o mid abdominal pain with nausea worsening x2 days and states started having small watery stools today and states has recurrent bowel blockages.

## 2017-09-22 NOTE — ED Provider Notes (Signed)
[facility] EMERGENCY DEPARTMENT Provider Note   CSN: [id number] Arrival date & time: 09/22/17  1149     History   Chief Complaint Chief Complaint  Patient presents with  . Abdominal Pain    HPI Gary Marsh is a 66 y.o. male.  HPI Patient presents with abdominal pain nausea and vomiting for the last 2 days.  Also watery stools.  He states he has had symptoms like this with bowel obstructions in the past.  Has had recurrent bowel obstructions.  Has had adhesions and infected mesh in the past.  Belly is mildly enlarged.  No fevers or chills.  Has had a mildly decreased appetite.  No sick contacts.  Pain is dull.  No blood in the emesis or stool. Past Medical History:  Diagnosis Date  . Chronic back pain   . DDD (degenerative disc disease)   . GERD (gastroesophageal reflux disease)   . MVP (mitral valve prolapse)   . S/P colonoscopy April 2010   left-sided diverticula, small rectal polyp  . S/P endoscopy April 2010   small antral erosion  . Urinary hesitancy    Hx    Patient Active Problem List   Diagnosis Date Noted  . Intestinal adhesions with complete obstruction (HCC)   . SBO (small bowel obstruction) (HCC) 05/28/2017  . Hyperglycemia 05/28/2017  . Hemorrhoid 03/03/2011  . LLQ abdominal pain 03/03/2011  . CHEST PAIN UNSPECIFIED 10/16/2010  . MELENA 04/05/2009  . DIARRHEA 04/05/2009  . ABDOMINAL PAIN OTHER SPECIFIED SITE 04/05/2009  . GERD 02/03/2009  . DEGENERATIVE DISC DISEASE 02/03/2009  . BACK PAIN, CHRONIC 02/03/2009  . NAUSEA WITH VOMITING 02/03/2009  . URINARY HESITANCY 02/03/2009  . ABDOMINAL PAIN 02/03/2009  . MITRAL VALVE PROLAPSE, HX OF 02/03/2009    Past Surgical History:  Procedure Laterality Date  . APPENDECTOMY    . back surgery X 7    . CHOLECYSTECTOMY    . COLONOSCOPY  02/20/2009   WUJ:WJXBJYRMR:Rectal polyp status post hot snare removal/ scattered left-sided diverticula  . ESOPHAGOGASTRODUODENOSCOPY  02/20/2009   RMR: Normal esophagus.  A  small antral ulceration status post biopsy   . HERNIA REPAIR    . left inguinal hernia repair         Home Medications    Prior to Admission medications   Medication Sig Start Date End Date Taking? Authorizing Provider  atenolol (TENORMIN) 25 MG tablet Take 25 mg by mouth as needed (heart rate). Takes when needed, slows his heart rate down   Yes [provider]  furosemide (LASIX) 40 MG tablet Take 40 mg by mouth daily as needed for fluid or edema.    Yes [provider]  pantoprazole (PROTONIX) 40 MG tablet Take 1 tablet (40 mg total) by mouth daily. 06/05/17  Yes Philip AspenHernandez Acosta, Limmie PatriciaEstela Y, MD  pregabalin (LYRICA) 150 MG capsule Take 1 capsule (150 mg total) by mouth 3 (three) times daily. 06/05/17  Yes Philip AspenHernandez Acosta, Limmie PatriciaEstela Y, MD  HYDROcodone-acetaminophen (NORCO/VICODIN) 5-325 MG tablet Take 1 tablet by mouth every 6 (six) hours as needed for moderate pain. Patient not taking: Reported on 09/22/2017 05/22/17   Bethann BerkshireZammit, Joseph, MD  ondansetron (ZOFRAN-ODT) 4 MG disintegrating tablet Take 1 tablet (4 mg total) by mouth every 8 (eight) hours as needed for nausea or vomiting. 09/22/17   Benjiman CorePickering, Arra Connaughton, MD    Family History Family History  Problem Relation Age of Onset  . Colon cancer Mother        deceased age 64  Social History Social History   Tobacco Use  . Smoking status: Never Smoker  . Smokeless tobacco: Current User    Types: Chew  Substance Use Topics  . Alcohol use: No  . Drug use: No     Allergies   Wheat bran; Carbamazepine; and Duloxetine   Review of Systems Review of Systems  Constitutional: Positive for appetite change.  HENT: Negative for congestion.   Respiratory: Negative for chest tightness and shortness of breath.   Gastrointestinal: Positive for abdominal pain, diarrhea, nausea and vomiting.  Genitourinary: Negative for dysuria.  Musculoskeletal: Negative for back pain.  Neurological: Negative for numbness.  Hematological:  Negative for adenopathy.  Psychiatric/Behavioral: Negative for agitation.     Physical Exam Updated Vital Signs BP (!) 118/94   Pulse 95   Temp 98.4 F (36.9 C) (Oral)   Resp 18   Ht 5\' 10"  (1.778 m)   Wt 99.8 kg (220 lb)   SpO2 97%   BMI 31.57 kg/m   Physical Exam  Constitutional: He appears well-developed.  HENT:  Head: Normocephalic.  Cardiovascular: Normal rate and regular rhythm.  Pulmonary/Chest: Breath sounds normal.  Abdominal: There is no splenomegaly or hepatomegaly. There is no rebound and no guarding.  Mildly distended abdomen diffusely.  Mild diffuse tenderness.  No rebound or guarding.  No hernias palpated.  Neurological: He is alert.  Skin: Skin is warm. Capillary refill takes less than 2 seconds.  Psychiatric: He has a normal mood and affect.     ED Treatments / Results  Labs (all labs ordered are listed, but only abnormal results are displayed) Labs Reviewed  COMPREHENSIVE METABOLIC PANEL - Abnormal; Notable for the following components:      Result Value   Sodium 133 (*)    Potassium 3.4 (*)    CO2 21 (*)    Glucose, Bld 137 (*)    Calcium 8.6 (*)    All other components within normal limits  CBC WITH DIFFERENTIAL/PLATELET  LIPASE, BLOOD    EKG  EKG Interpretation None       Radiology Ct Abdomen Pelvis W Contrast  Result Date: 09/22/2017 CLINICAL DATA:  Acute mid abdominal pain. EXAM: CT ABDOMEN AND PELVIS WITH CONTRAST TECHNIQUE: Multidetector CT imaging of the abdomen and pelvis was performed using the standard protocol following bolus administration of intravenous contrast. CONTRAST:  ISOVUE-300 IOPAMIDOL (ISOVUE-300) INJECTION 61% COMPARISON:  CT scan of June 08, 2017. FINDINGS: Lower chest: No acute abnormality. Hepatobiliary: Status post cholecystectomy. Fatty infiltration of the liver is noted. Pancreas: Unremarkable. No pancreatic ductal dilatation or surrounding inflammatory changes. Spleen: Normal in size without focal  abnormality. Adrenals/Urinary Tract: Adrenal glands appear normal. Small nonobstructive left renal calculus is noted. Stable right renal cyst. No hydronephrosis or renal obstruction is noted. No ureteral calculi are noted. Urinary bladder appears normal. Stomach/Bowel: There is no evidence of bowel obstruction or inflammation. The stomach is unremarkable. Status post appendectomy. Vascular/Lymphatic: Aortic atherosclerosis. No enlarged abdominal or pelvic lymph nodes. Reproductive: Prostate is unremarkable. Other: Status post surgical repair of ventral hernia. No abnormal fluid collection is noted. 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 No acute abnormality is noted. IMPRESSION: Fatty infiltration of the liver. Small nonobstructive left renal calculus. No hydronephrosis or renal obstruction is noted. Aortic atherosclerosis. 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 Postsurgical changes are seen involving anterior abdominal wall consistent with prior hernia repair. No other abnormality seen in the abdomen or pelvis. Electronically Signed   By: Fayrene Fearing  Christen ButterGreen Jr, M.D.   On: 09/22/2017 17:30    Procedures Procedures (including critical care time)  Medications Ordered in ED Medications  ondansetron (ZOFRAN) injection 4 mg (4 mg Intravenous Given 09/22/17 1628)  sodium chloride 0.9 % bolus 1,000 mL (0 mLs Intravenous Stopped 09/22/17 1841)  iopamidol (ISOVUE-300) 61 % injection 100 mL (100 mLs Intravenous Contrast Given 09/22/17 1703)     Initial Impression / Assessment and Plan / ED Course  I have reviewed the triage vital signs and the nursing notes.  Pertinent labs & imaging results that were available during my care of the patient were reviewed by me and considered in my medical decision making (see chart for details).     Patient with abdominal pain.  Has had some nausea and vomiting.  History of bowel obstructions.  CT scan  reassuring.  Feels better.  Will discharge home.  Final Clinical Impressions(s) / ED Diagnoses   Final diagnoses:  Abdominal pain, unspecified abdominal location  Non-intractable vomiting with nausea, unspecified vomiting type    ED Discharge Orders        Ordered    ondansetron (ZOFRAN-ODT) 4 MG disintegrating tablet  Every 8 hours PRN     09/22/17 1825       Benjiman CorePickering, Zimal Weisensel, MD 09/22/17 2020

## 2018-11-02 IMAGING — DX DG ABDOMEN ACUTE W/ 1V CHEST
4 series · 4 of 4 positions shown · non-contrast
Comparison: 12/26/2012

CLINICAL DATA: Center and lower Abd pain and nausea since last
night. Reports history of 2 SBOs in the past (most recent in
[REDACTED] of this yr) and this feels similar. Pt reports some watery
stool today.

EXAM:
DG ABDOMEN ACUTE W/ 1V CHEST

[chest pa]
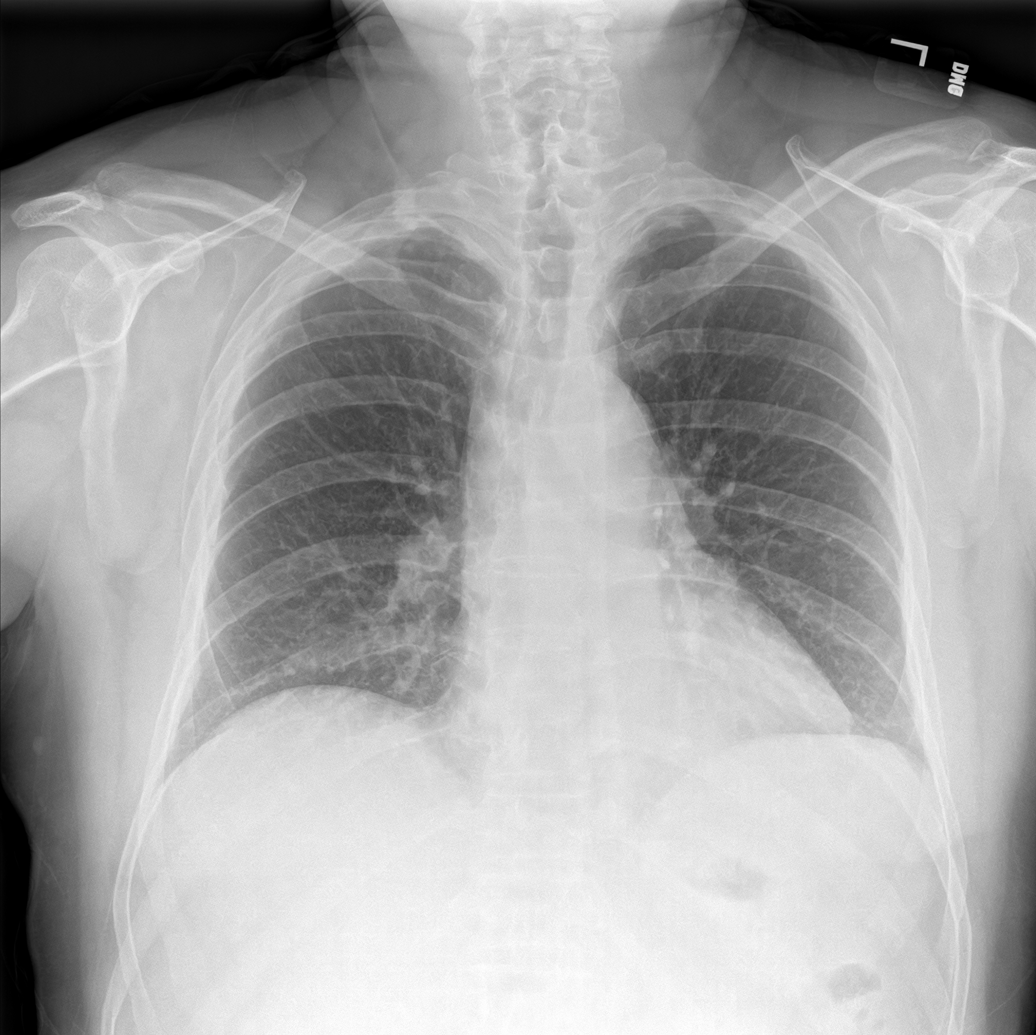

[abdomen erect]
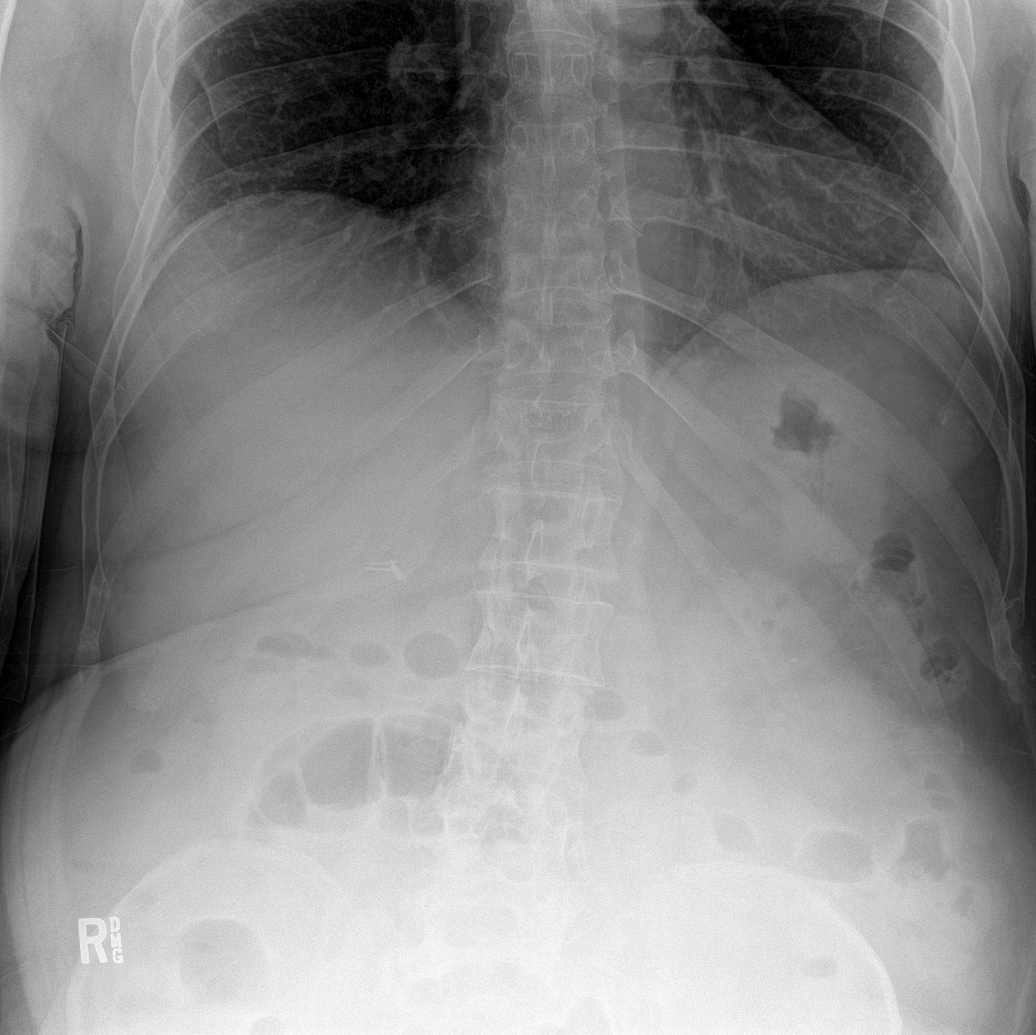

[abdomen supine (1 of 2)]
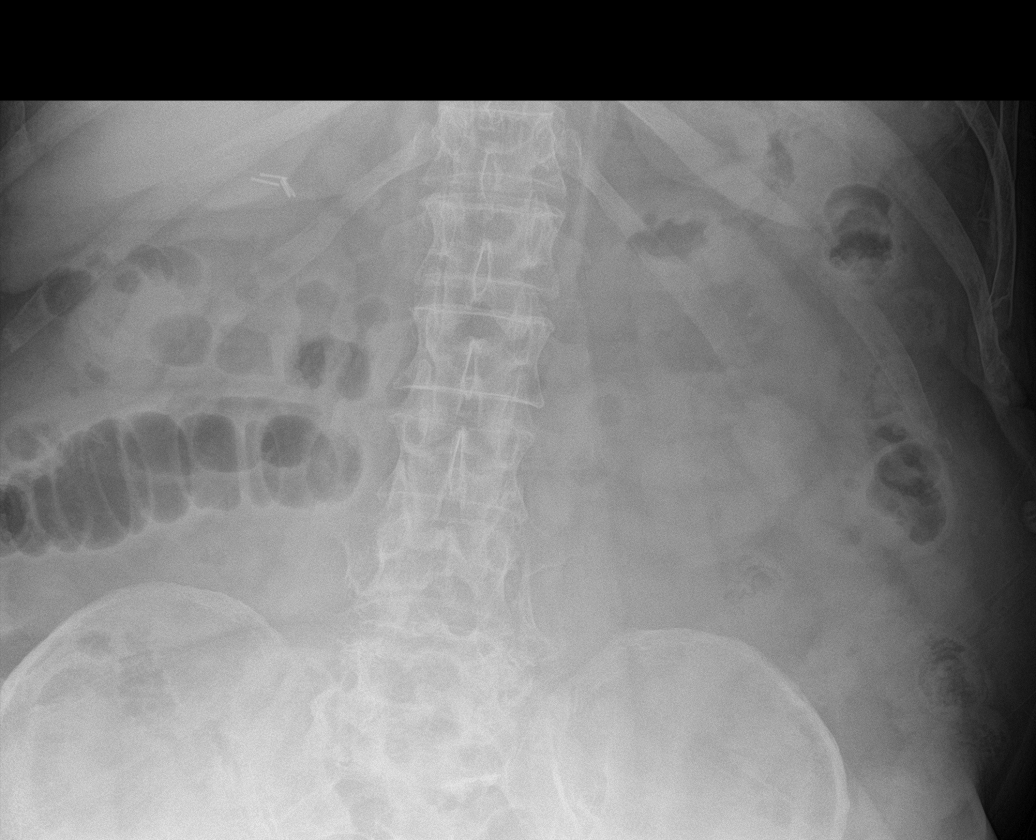

[abdomen supine (2 of 2)]
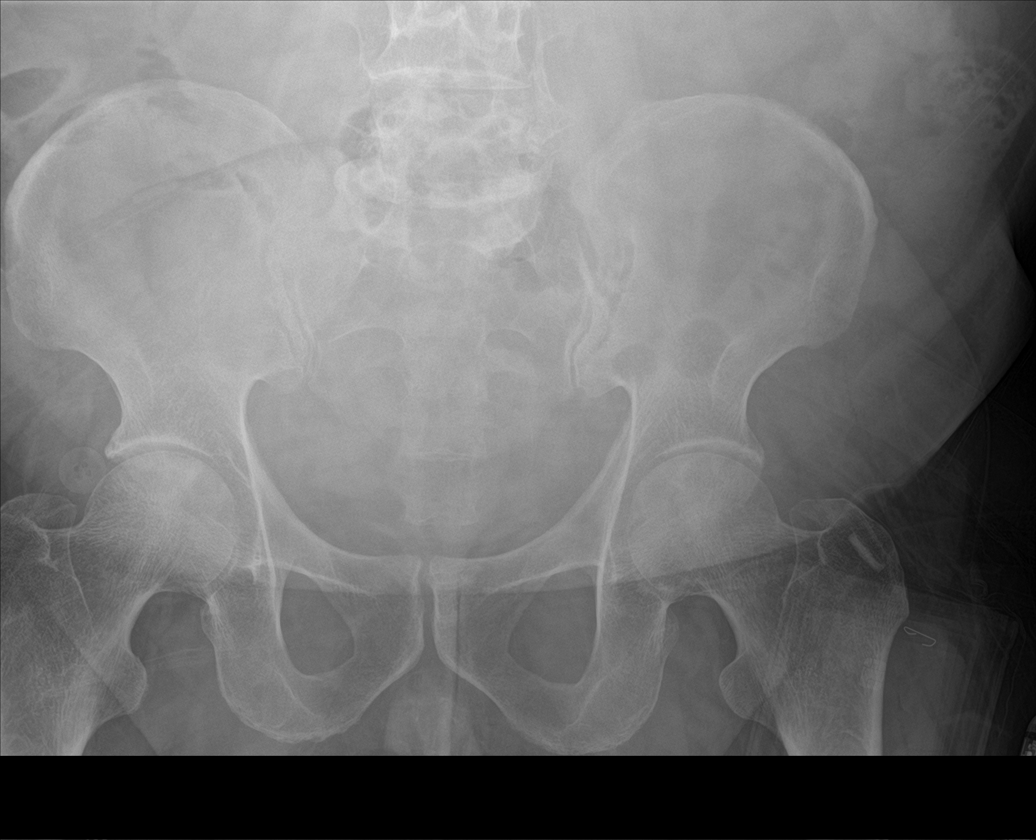

[4 of 4 positions shown; findings below may reference images not displayed]

FINDINGS: Heart size is normal. There are no focal consolidations or pleural
effusions. Bibasilar scarring appears stable. Biapical
pleuroparenchymal changes are chronic. There is no free
intraperitoneal air beneath the diaphragm. Supine and erect views of
the abdomen show mildly prominent loops of small bowel in the right
lower quadrant consistent with focal ileus or early obstruction.
Visualized large bowel loops are not obstructed. Degenerative
changes are seen in the spine.
IMPRESSION: 1.  No evidence for acute cardiopulmonary abnormality.
2. Mildly dilated loops of small bowel in the right central abdomen
consistent with focal ileus or early small bowel obstruction.

## 2018-11-02 IMAGING — CT CT ABD-PELV W/ CM
2 of 4 series · 16 of 46 positions shown, 18 images · IV contrast (Isovue)
Comparison: December 22, 2012

CLINICAL DATA: Abdomen pain a nausea since last night

EXAM:
CT ABDOMEN AND PELVIS WITH CONTRAST
TECHNIQUE: Multidetector CT imaging of the abdomen and pelvis was performed
using the standard protocol following bolus administration of
intravenous contrast.
CONTRAST:  100mL H9ZDRW-LCC IOPAMIDOL (H9ZDRW-LCC) INJECTION 61%

[Series 2: axial st · axial · 0.91mm/px · z∈[+456,+926]mm · 13 of 104 slices shown, 15 images]
[im 5/104  soft-tissue]
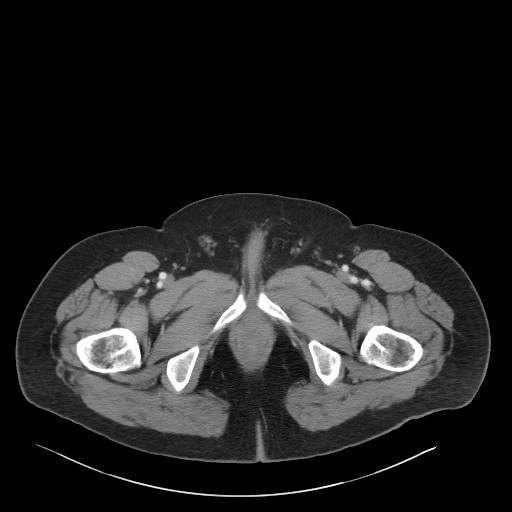
[im 5/104  bone]
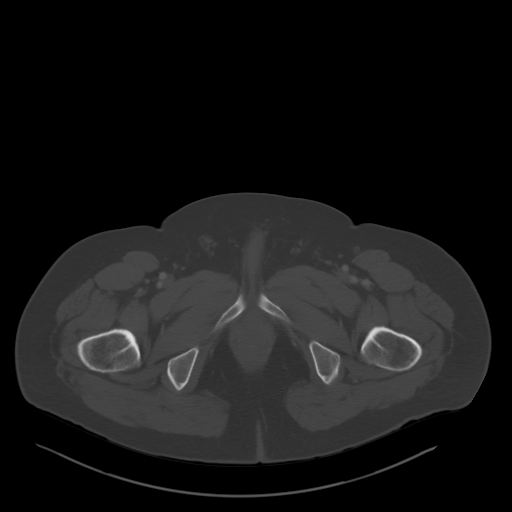
[im 15/104  soft-tissue]
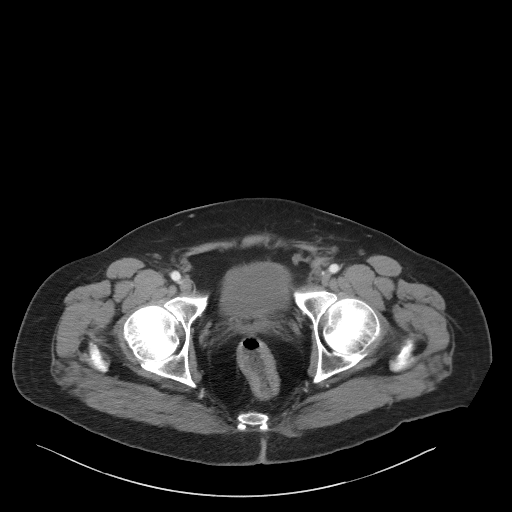
[im 20/104  soft-tissue]
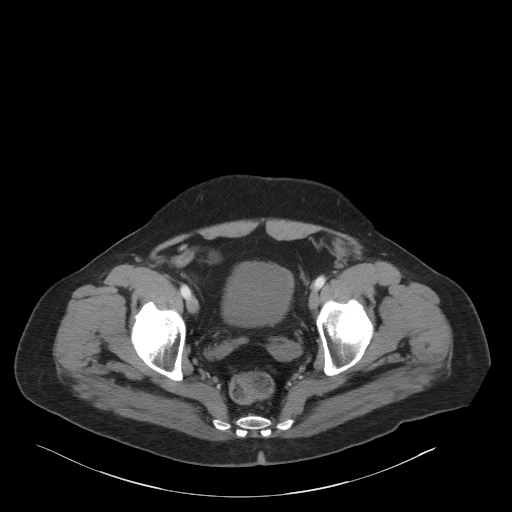
[im 30/104  soft-tissue]
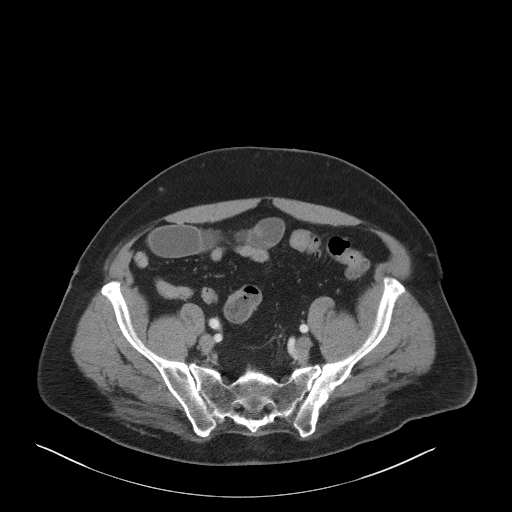
[im 35/104  soft-tissue]
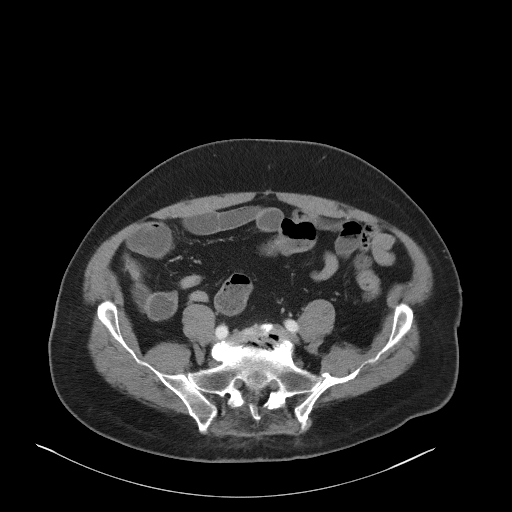
[im 45/104  soft-tissue]
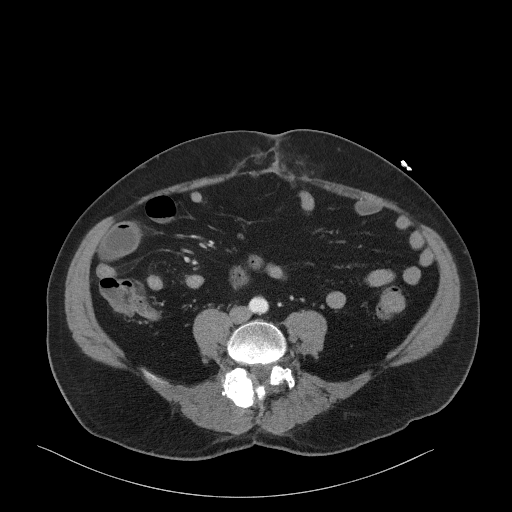
[im 54/104  soft-tissue]
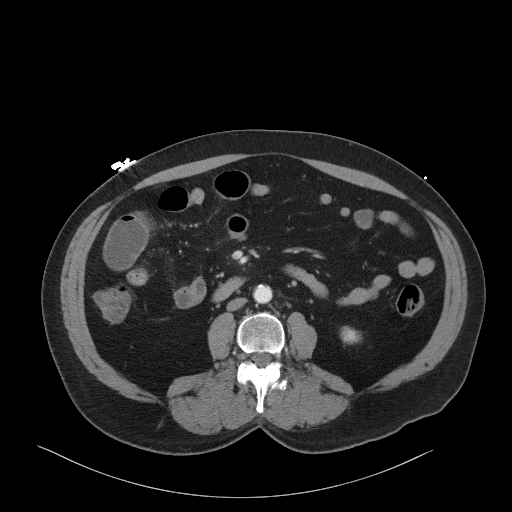
[im 59/104  soft-tissue]
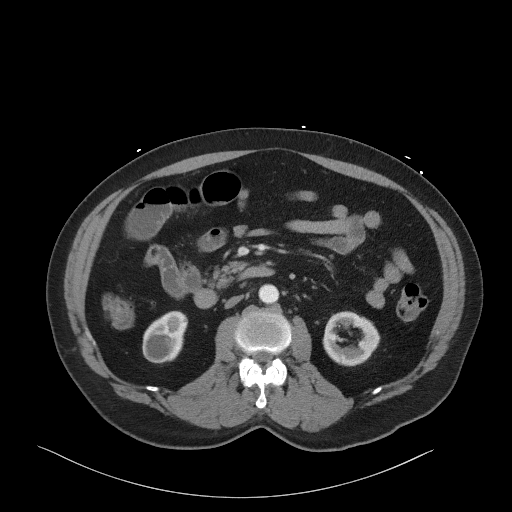
[im 69/104  soft-tissue]
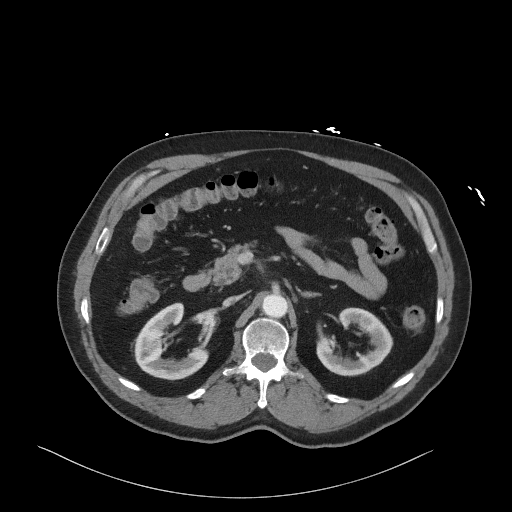
[im 69/104  bone]
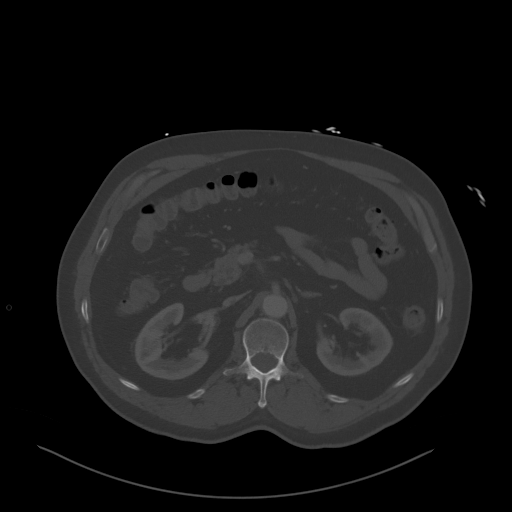
[im 74/104  soft-tissue]
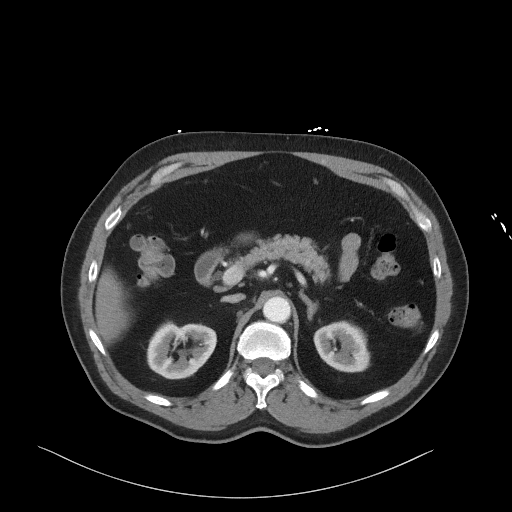
[im 84/104  soft-tissue]
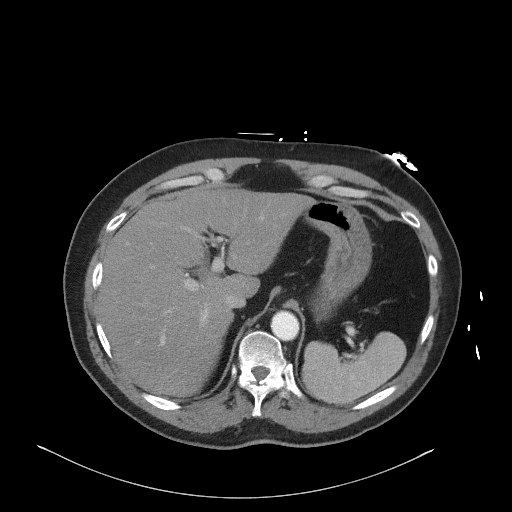
[im 89/104  soft-tissue]
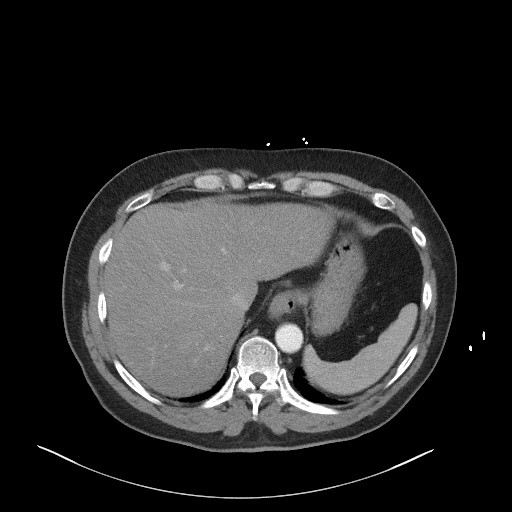
[im 99/104  soft-tissue]
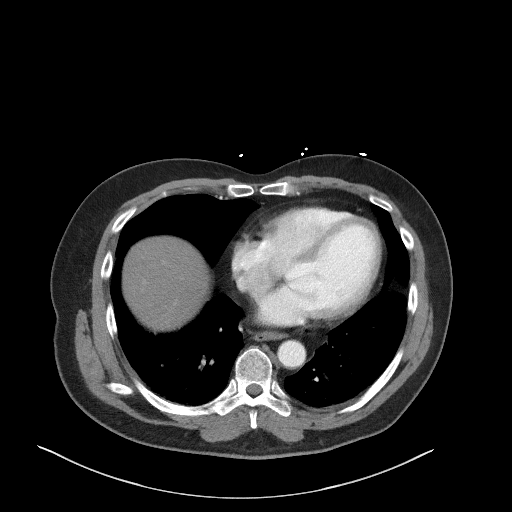

[Series 5: coronal st · coronal · 0.91mm/px · 3 of 102 slices shown]
[im 34/102  soft-tissue]
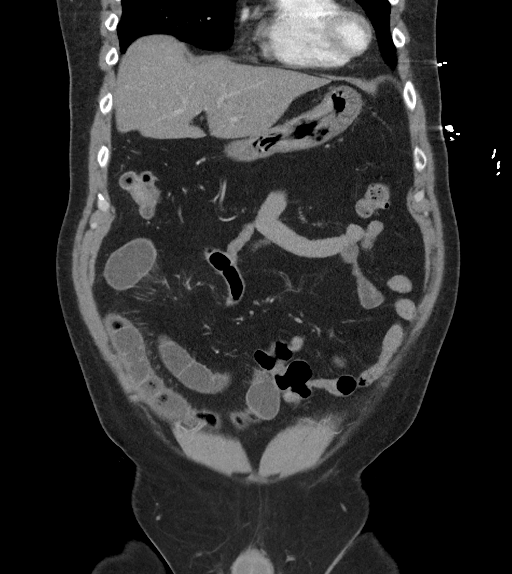
[im 45/102  soft-tissue]
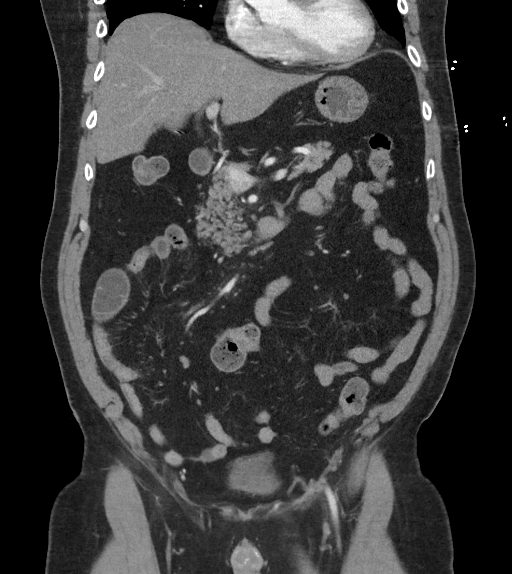
[im 57/102  soft-tissue]
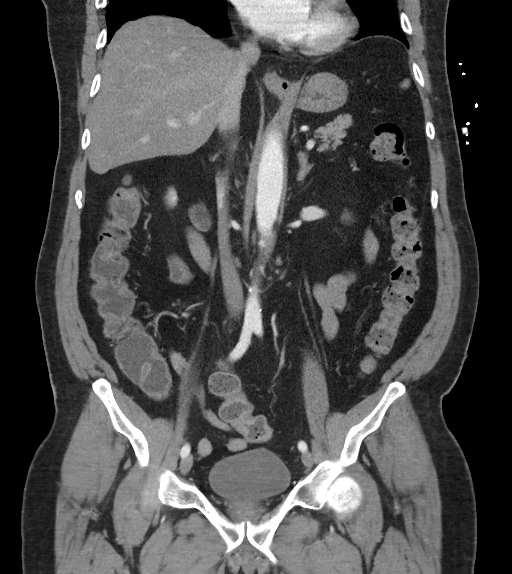

[16 of 46 positions shown; findings below may reference images not displayed]

FINDINGS: Lower chest: There is atelectasis of the posterior lung bases. There
is no focal pneumonia. The heart size is upper limits of normal.

Hepatobiliary: There is diffuse low density of the liver without
vessel displacement. No focal liver lesion is identified. Patient
status post prior cholecystectomy.

Pancreas: Unremarkable. No pancreatic ductal dilatation or
surrounding inflammatory changes.

Spleen: Normal in size without focal abnormality.

Adrenals/Urinary Tract: Adrenal glands are unremarkable. Kidneys are
normal, without renal calculi, or hydronephrosis. There is a 2.5 cm
simple cyst in the lower pole right kidney. Bladder is unremarkable.

Stomach/Bowel: There is a small hiatal hernia. Stomach is otherwise
within normal limits. Patient status post prior appendectomy. No
evidence of bowel wall thickening, or inflammatory changes. There is
a mild focal dilated small bowel loop in the right mid abdomen.

Vascular/Lymphatic: Aortic atherosclerosis. No enlarged abdominal or
pelvic lymph nodes.

Reproductive: Prostate is unremarkable.

Other: There is peri umbilical herniation of mesenteric fat. There
is no ascites.

Musculoskeletal: Degenerative joint changes of the lower lumbar
spine are noted.
IMPRESSION: Focal dilated small bowel loop in the right mid abdomen. This could
be due to focal ileus.

No evidence of diverticulitis.

Status post prior appendectomy and cholecystectomy.

## 2022-12-05 DIAGNOSIS — G4733 Obstructive sleep apnea (adult) (pediatric): Secondary | ICD-10-CM

## 2022-12-05 DIAGNOSIS — E119 Type 2 diabetes mellitus without complications: Secondary | ICD-10-CM

## 2022-12-05 DIAGNOSIS — E872 Acidosis, unspecified: Secondary | ICD-10-CM

## 2022-12-05 DIAGNOSIS — J441 Chronic obstructive pulmonary disease with (acute) exacerbation: Secondary | ICD-10-CM

## 2022-12-09 ENCOUNTER — Telehealth (INDEPENDENT_AMBULATORY_CARE_PROVIDER_SITE_OTHER): Payer: Self-pay | Admitting: PULMONARY DISEASE

## 2022-12-09 NOTE — Nursing Note (Unsigned)
Per Urban Gibson pt needs a 4 to 6 wk hospital follow up with a PFT's. Dr. Saverio Danker consulted at [facility] on 12/05/2022. Pt was discharged on 12/07/2022.    No Micro      Please get pt scheduled thanks

## 2022-12-11 NOTE — Telephone Encounter (Signed)
Called pt, scheduled NHD and PFT.

## 2023-01-30 ENCOUNTER — Encounter (INDEPENDENT_AMBULATORY_CARE_PROVIDER_SITE_OTHER): Payer: Self-pay | Admitting: PULMONARY DISEASE

## 2023-02-03 ENCOUNTER — Encounter (INDEPENDENT_AMBULATORY_CARE_PROVIDER_SITE_OTHER): Payer: Self-pay | Admitting: NURSE PRACTITIONER

## 2023-02-03 ENCOUNTER — Other Ambulatory Visit: Payer: Self-pay

## 2023-02-03 ENCOUNTER — Inpatient Hospital Stay (HOSPITAL_BASED_OUTPATIENT_CLINIC_OR_DEPARTMENT_OTHER)
Admission: RE | Admit: 2023-02-03 | Discharge: 2023-02-03 | Disposition: A | Discharge status: Home / Self Care | Payer: Medicare Other | Source: Ambulatory Visit | Attending: NURSE PRACTITIONER | Admitting: NURSE PRACTITIONER

## 2023-02-03 ENCOUNTER — Ambulatory Visit (HOSPITAL_COMMUNITY): Payer: Self-pay | Admitting: NURSE PRACTITIONER

## 2023-02-03 ENCOUNTER — Ambulatory Visit: Payer: Medicare Other | Attending: NURSE PRACTITIONER | Admitting: NURSE PRACTITIONER

## 2023-02-03 VITALS — BP 120/84 | HR 82 | Temp 97.8°F | Resp 18 | Ht 68.0 in | Wt 216.0 lb

## 2023-02-03 DIAGNOSIS — G4733 Obstructive sleep apnea (adult) (pediatric): Secondary | ICD-10-CM

## 2023-02-03 DIAGNOSIS — R9389 Abnormal findings on diagnostic imaging of other specified body structures: Secondary | ICD-10-CM

## 2023-02-03 DIAGNOSIS — R0602 Shortness of breath: Secondary | ICD-10-CM

## 2023-02-03 DIAGNOSIS — J309 Allergic rhinitis, unspecified: Secondary | ICD-10-CM

## 2023-02-03 DIAGNOSIS — I2510 Atherosclerotic heart disease of native coronary artery without angina pectoris: Secondary | ICD-10-CM

## 2023-02-03 DIAGNOSIS — R0609 Other forms of dyspnea: Secondary | ICD-10-CM | POA: Insufficient documentation

## 2023-02-03 DIAGNOSIS — J449 Chronic obstructive pulmonary disease, unspecified: Secondary | ICD-10-CM

## 2023-02-03 NOTE — Progress Notes (Signed)
[facility]  [address]  [place] [id number]       Follow up/Progress Note    Patient Name: Joel Weber  Date: 02/03/2023  [facility]  [facility]  MRN: [id number]  DOB: Mar 07, 1951  Primary Care Provider:  No Pcp  Referring Provider:  No ref. provider found      Chief Complaint:   Chief Complaint   Patient presents with    Hospital Follow Up     Nursing Notes:   Cecile SheererCopen, [place], [place]  02/03/23 [id number]  Signed  Is the patient currently on oxygen therapy? No  What Liter Flow is the patient on? N/A  What DME company does the patient utilize for oxygen therapy? N/A  What is the patient on? n/a  What DME company does the patient utilize for CPAP/BiPAP/Trilogy? N/A  Smoking History:    Social History     Tobacco Use   Smoking Status Never   Smokeless Tobacco Current    Types: Snuff      Have you received a flu shot? No  Have you received a pneumonia shot? No  Has the patient had any tests performed since the last visit? Chest Xray  If so, where were the test(s) performed?  [place]  Has the patient had any recent hospitalizations? Yes  Does the patient have any other associated symptoms or modifying factors? Shortness of breath         HPI:    Patient with a history of COPD, OSA, mitral valve prolapse, diabetes, hypothyroidism, and celiac disease who is here for new hospital follow up.    [facility] and presented to the ER for shortness of breath that had been ongoing for 3-4 weeks. Prior to this he reported he had been treated at outlying facility for COPD exacerbation. CT chest was negative for acute findings, but did show CAD.  Echo showed some diastolic dysfunction. Shortness of breath resolved and patient was recommended to follow up in office with PFTs.    He reports he was recently diagnosed with COPD by his PCP, but had never had PFT's or seen a pulmonologist. Today's PFT's show  no obstruction or restriction and were essentially normal.  He was having worsening shortness of breath several months ago, however, since hospitalization he has been walking 2 miles every day and has lost about 20 lbs and shortness of breath has mostly resolved.     He takes Astelin and Claritin for allergies, but does not always take these regularly.     Patient [facility] but had difficulty tolerating CPAP device and quit.  His initial sleep study on 07/27/2018 showed an AHI of 47.8.    He is a lifelong nonsmoker, but did have secondhand smoke exposure. 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     Past Medical History:  Past Medical History:   Diagnosis Date    Cough     Diabetes mellitus (CMS HCC)     Hypothyroidism     Shortness of breath     Sleep apnea          Past Surgical History  Past Surgical History:   Procedure Laterality Date    HX APPENDECTOMY      HX BACK SURGERY      HX CHOLECYSTECTOMY  HX HEART VALVE SURGERY         Medication List  Current Outpatient Medications   Medication Sig    ACCU-CHEK FASTCLIX LANCET DRUM Does not apply Misc TEST BLOOD SUGAR ONCE DAILY    ACCU-CHEK GUIDE TEST STRIPS Does not apply Strip TEST BLOOD SUGAR ONCE DAILY    albuterol sulfate (PROVENTIL OR VENTOLIN OR PROAIR) 90 mcg/actuation Inhalation oral inhaler 2 puffs Inhalation every 4-6 hrs  As needed    albuterol sulfate (PROVENTIL) 2.5 mg /3 mL (0.083 %) Inhalation nebulizer solution 3 mL as needed Inhalation every 6 hrs    atenoloL (TENORMIN) 25 mg Oral Tablet 1 Tablet (25 mg total)    azelastine (ASTELIN) 137 mcg (0.1 %) Nasal Aerosol, Spray 1 Spray    dapagliflozin propanediol (FARXIGA) 10 mg Oral Tablet Oral    ergocalciferol, vitamin D2, (DRISDOL) 1,250 mcg (50,000 unit) Oral Capsule 1 Capsule (50,000 Units total) (Patient not taking: Reported on 02/03/2023)    finasteride (PROSCAR) 5 mg Oral  Tablet 1 Tablet (5 mg total) (Patient not taking: Reported on 02/03/2023)    fluticasone propionate (FLONASE) 50 mcg/actuation Nasal Spray, Suspension USE 1 SPRAY INTO EACH NOSTRIL ONCE DAILY    hydrOXYzine HCL (ATARAX) 25 mg Oral Tablet Take 1 Tablet (25 mg total) by mouth Every night as needed (Patient not taking: Reported on 02/03/2023)    LANTUS U-100 INSULIN 100 unit/mL Subcutaneous injection (vial) Subcutaneous for 28 Days (Patient not taking: Reported on 02/03/2023)    levothyroxine (SYNTHROID) 88 mcg Oral Tablet 1 tablet in the morning on an empty stomach Oral Once a day for 30 days    loperamide (IMODIUM) 2 mg Oral Capsule TAKE ONE TABLET BY MOUTH FOUR TIMES DAILY AS NEEDED    loratadine (CLARITIN) 10 mg Oral Tablet Take 1 Tablet (10 mg total) by mouth Once a day    LYRICA 150 mg Oral Capsule 1 Capsule (150 mg total)    meloxicam (MOBIC) 15 mg Oral Tablet Take 1 Tablet (15 mg total) by mouth Once a day (Patient not taking: Reported on 02/03/2023)    MetFORMIN (GLUCOPHAGE) 1,000 mg Oral Tablet 1 Tablet (1,000 mg total)    metoclopramide HCl (REGLAN) 5 mg Oral Tablet 1 Tablet (5 mg total)    naloxone (NARCAN) 4 mg per spray nasal spray use 1 spray into 1 nostril EVERY 3 minutes AS NEEDED for opIOId overdose. alternate notrils with each dose until help arrives    pantoprazole (PROTONIX) 40 mg Oral Tablet, Delayed Release (E.C.) 1 Tablet (40 mg total)    Sildenafil (VIAGRA) 100 mg Oral Tablet TAKE ONE TABLET BY MOUTH ONCE DAILY AS NEEDED FOR SEXUAL ACTIVITY. ADMINISTER 30 MINUTES TO FOUR HOURS BEFORE ACTIVITY (Patient not taking: Reported on 02/03/2023)    tamsulosin (FLOMAX) 0.4 mg Oral Capsule TAKE ONE CAPSULE BY MOUTH TWICE DAILY FOR KIDNEY STONE     Allergy List  Allergy History as of 02/03/23       WHEAT         Noted Status Severity Type Reaction    02/03/23 0918 Copen, [place], MA 11/08/21 Active High      Comments: Other Reaction(s): Abdominal Pain               AMPICILLIN         Noted Status Severity Type  Reaction    02/03/23 0918 Copen, [place], MA 02/03/23 Active Medium                CARBAMAZEPINE  Noted Status Severity Type Reaction    02/03/23 0918 Copen, [place], MA 12/17/22 Active Medium  Hives/ Urticaria              DULOXETINE         Noted Status Severity Type Reaction    02/03/23 0918 Copen, [place], MA 12/17/22 Active High  Anaphylaxis, Hives/ Urticaria              PENICILLIN         Noted Status Severity Type Reaction    02/03/23 0918 Copen, [place], MA 02/03/23 Active Medium                CARBAPENEMS         Noted Status Severity Type Reaction    02/03/23 0918 Copen, [place], MA 11/08/21 Active                 CELECOXIB         Noted Status Severity Type Reaction    02/03/23 0918 Copen, [place], MA 11/08/21 Active                 MILK         Noted Status Severity Type Reaction    02/03/23 0918 Copen, [place], MA 11/08/21 Active                 BARIUM SULFATE         Noted Status Severity Type Reaction    02/03/23 0918 Copen, [place], MA 02/03/23 Active Low  Hives/ Urticaria                  Family History   Family Medical History:       Problem Relation (Age of Onset)    Asthma Father    COPD Father    Cancer Mother    Congestive Heart Failure Father    Diabetes Mother    Heart Attack Father            Social History  Social History     Socioeconomic History    Marital status: Married   Tobacco Use    Smoking status: Never    Smokeless tobacco: Current     Types: Snuff   Vaping Use    Vaping status: Never Used        Review of system  General:  Denies fever, chills, night sweats, fatigue, loss of appetite.  Neurological:  Denies headache, snoring.   Gastrointestinal:  Denies reflux, heartburn, diarrhea.  Cardiovascular:  Denies chest pain, irregular heartbeats.  Respiratory:  See HPI.  Musculoskeletal:  Denies joint pain, restless legs.  Endocrine/Metabolic:  Denies weight gain, weight loss.  Immunologic:  Denies sinus allergy symptoms.  Mental Status/Psychiatric:  Denies anxiety,  depression.  ENT:  Denies nasal congestion, hoarseness, postnasal drip.  Integumentary:  Denies rash, new skin lesions.    Objective:  Vital Signs  BP 120/84 (Site: Right, Patient Position: Sitting, Cuff Size: Adult)   Pulse 82   Temp 36.6 C (97.8 F)   Resp 18   Ht 1.727 m (5\' 8" )   Wt 98 kg (216 lb)   SpO2 98%   BMI 32.84 kg/m          PHYSICAL EXAMINATION:   Constitutional:  Vital signs stable.  General appearance of the patient:  Alert, no acute distress.  Normal appearance, obese.  Eyes: PERRLA and normal eye lids.  Conjunctiva normal.  Ears, Nose, Mouth, and Throat: External inspection of ears and nose  with normal appearance.  Inspection of lips, teeth and gums with normal appearance and oral mucosa normal.  Neck: Supple with trachea midline.  Respiratory:  Auscultation of lungs with normal breath sounds, no rales, no rhonchi, no wheezing.  Respiratory effort with no tractions, breathing regular and unlabored.  Cardiovascular:  Regular rhythm and regular rate.    Gastrointestinal:  Abdominal obesity.  Musculoskeletal:  Normal gait and station, normal digits, no digital cyanosis or clubbing.  Mental Status/Psychiatric:  Alert, grossly oriented to person, place, and time.  Appropriate and normal mood.    Assessment    ICD-10-CM    1. OSA (obstructive sleep apnea)  G47.33       2. Dyspnea on exertion  R06.09       3. Abnormal chest CT  R93.89 Referral to Provider - External      4. Allergic rhinitis  J30.9       5. Coronary artery disease, unspecified vessel or lesion type, unspecified whether angina present, unspecified whether native or transplanted heart  I25.10 Referral to Provider - External          Plan  Instructions  Continue medications as prescribed/directed unless changed by provider.  Plan of care discussed with patient.    Patient's shortness of breath has significantly improved since increasing his activity.  Patient's PFTs today were WNL. Continue to increase activity as tolerated.    Will  order titration study and resume CPAP ASAP.  Assess the risk of untreated OSA with patient.    Will send referral to Cardiology for significant family history of CAD, history of mitral valve prolapse, CAD seen on CT imaging while hospitalized, and diastolic dysfunction on echo.    Will follow up in 21 weeks.     Patient was seen and examined by Dr. Mordecai Maes.  He was present for the majority of the visit.  He is in agreement with the note and plan unless otherwise noted on his addendum.     Return in about 21 weeks (around 06/30/2023).     The patient was given the opportunity to ask questions and those questions were answered to the patient's satisfaction. The patient was encouraged to call with any additional questions or concerns. Discussed with the patient effects and side effects of medications. Medication safety was discussed.  The patient was informed to contact the office within 7 business days if a message/lab results/referral/imaging results have not been conveyed to the patient.    Electronically signed by Hezzie Bump, FNP-C    Pulmonary attending:  Pulmonary function testing personally reviewed by me with normal findings.  Likely has component of untreated sleep apnea that is contributing to his shortness of breath.  However his shortness of breath has improved since he started increasing his activity and walking 2 miles daily   We will get him started back on CPAP after titration study has been completed   Refer to Cardiology for coronary artery disease in the family as well as his mitral valve prolapse and coronary calcifications noted on CT scan of the chest   Echo reviewed with the patient with diastolic dysfunction      I provided direct patient care for this patient.   Patient seen and examined personally by me.   Pertinent labs and images/reports were reviewed personally by me if available.  Chart records/history personally reviewed by me  Assessment and plan of care reviewed in detail and discussed  with the patient and mid-level provider.   I performed  majority of Medical Decision Making of this visit and participated in the data collection with the mid-level provider.   Patient has chronic illness requiring review of testing, ordering of follow-up testing, and interpretation of test results.  Acute problems were also addressed during this visit.      Duard Brady, DO  02/03/2023 11:15      This note may have been partially generated using MModal Fluency Direct system, and there may be some incorrect words, spellings, and punctuation that were not noted in checking the note before saving.

## 2023-02-03 NOTE — Addendum Note (Signed)
Addended by: Hezzie Bump on: 02/03/2023 11:58 AM     Modules accepted: Orders

## 2023-02-03 NOTE — Nursing Note (Signed)
Is the patient currently on oxygen therapy? No  What Liter Flow is the patient on? N/A  What DME company does the patient utilize for oxygen therapy? N/A  What is the patient on? n/a  What DME company does the patient utilize for CPAP/BiPAP/Trilogy? N/A  Smoking History:    Social History     Tobacco Use   Smoking Status Never   Smokeless Tobacco Current    Types: Snuff      Have you received a flu shot? No  Have you received a pneumonia shot? No  Has the patient had any tests performed since the last visit? Chest Xray  If so, where were the test(s) performed?  [place]  Has the patient had any recent hospitalizations? Yes  Does the patient have any other associated symptoms or modifying factors? Shortness of breath

## 2023-02-24 ENCOUNTER — Inpatient Hospital Stay
Admission: RE | Admit: 2023-02-24 | Discharge: 2023-02-24 | Disposition: A | Discharge status: Home / Self Care | Payer: Medicare Other | Source: Ambulatory Visit | Attending: PULMONARY DISEASE | Admitting: PULMONARY DISEASE

## 2023-02-24 ENCOUNTER — Other Ambulatory Visit: Payer: Self-pay

## 2023-02-24 DIAGNOSIS — G4733 Obstructive sleep apnea (adult) (pediatric): Secondary | ICD-10-CM

## 2023-02-25 ENCOUNTER — Telehealth (INDEPENDENT_AMBULATORY_CARE_PROVIDER_SITE_OTHER): Payer: Self-pay | Admitting: PULMONARY DISEASE

## 2023-02-25 DIAGNOSIS — G4733 Obstructive sleep apnea (adult) (pediatric): Secondary | ICD-10-CM

## 2023-02-25 DIAGNOSIS — R0683 Snoring: Secondary | ICD-10-CM

## 2023-02-25 NOTE — Procedures (Signed)
[facility]    Titration Study    Patient Name: Joel Weber  Date of Birth: December 24, 1950  Gender: male  Provider: Regenia Skeeter  Location: [facility]  Location Address: [address] [place], [place], [place] [address]  Location Phone: [phone]      Primary Care Provider: No Pcp      Procedure Titration Study  Past Medical History  Past Medical History:   Diagnosis Date    Cough     Diabetes mellitus (CMS HCC)     Hypothyroidism     Shortness of breath     Sleep apnea            Past Surgical History  Past Surgical History:   Procedure Laterality Date    HX APPENDECTOMY      HX BACK SURGERY      HX CHOLECYSTECTOMY      HX HEART VALVE SURGERY             Medication LIst  No outpatient medications have been marked as taking for the 02/24/23 encounter [facility] Encounter) with PAC, SLEEP LAB.       Allergy List  Allergies   Allergen Reactions    Duloxetine Anaphylaxis and Hives/ Urticaria    Wheat      Other Reaction(s): Abdominal Pain    Ampicillin     Carbamazepine Hives/ Urticaria    Penicillin     Carbapenems     Celecoxib     Milk     Barium Sulfate Hives/ Urticaria       Family Medical History  Family Medical History:       Problem Relation (Age of Onset)    Asthma Father    COPD Father    Cancer Mother    Congestive Heart Failure Father    Diabetes Mother    Heart Attack Father              Social History  Social History     Socioeconomic History    Marital status: Married   Tobacco Use    Smoking status: Never    Smokeless tobacco: Current     Types: Snuff   Vaping Use    Vaping status: Never Used         Patient Information  Sleep apnea symptoms: Snoring and Witnessed Apneas  Patient's Height: 68  Patient's Weight: 216  Patient's BMI: 32.8  Neck Circumference: 18  Epworth Scale Score: 6    Date of Study: 02/24/2023  Study Performed By: Tiana Loft RRT/SDS   Study Reviewed By: Tiana Loft RRT/SDS    Procedure : Procedure: Complete PSG titration with digital sleep  system using the international 10-20 electrode placement for recording EEG, EOG, EMG from chin, ECG, Respiratory Effort, Oximetry, body position, airflow, snoring sound, pulse rate, limb movement channels, and with positive airway pressure.    Sleep Architecture: Total Recording Time: 424.7 min Total Sleep Time: 367.3 min. Sleep Efficiency Percentage: 86.5%. Patient Sleep Latency (Minutes): 7.9 min. Patient REM Latency (Minutes): 93.5 min. Percentage of Stage I Sleep:  5.0 %. Percentage of Stage II Sleep: 63.5 %. Percentage of Stage III Sleep: 14.6 %. Percentage (%) of REM sleep: 16.9 %.   Respiratory Data: Number of Obstructive Apneas Observed: 0 . Number of Central Apneas Observed: 0. Number of Mixed Apneas Observed: 0. Total Number of Apneas: 0. Total number of Hypopneas: 6. Snoring During the Study: Yes, mild  Apnea Hypopnea Index (AHI) per  hour: 1.0. Percent of Time Patient Spent in Supine Position: 0.68 %. AHI in Supine Position #: 24. Non-supine AHI #: 0.82. REM AHI #: 1.0. Non-REM AHI #:  1.0. Lowest Desaturation Percentage: 89%. Total Amount of Desaturated Time Below or Equal to 0.4 . Longest Apneic Event (In Seconds): 40 seconds.   Cardiac Data: The Mean Heart Rate During the Study (Beats/Min): 72.2. Sinus Rhythm: Abnormal   Periodic Leg Movements: Total Number of Periodic Leg Movements During the Study: 0. PLM Index #: 0  CPAP - BiPAP Titration Data : CPAP Yes Average pressure used to best reduce AHI was?: 8 CMH2O. At that pressure AHI was: 0. CPAP tolerated well.  Mask used was: Large Dreamwear FFM .  Humidity was utilized.   The study was scored by: 30 second EPOCH, 4 % desaturation and  CMS guidelines.AASM Accredited     Assessment:   Diagnosis:   (R06.83) Snoring (SCT) and (G47.33) OSA - Obstructive sleep apnea (SCT)    PLAN  Procedure Orders  CPAP 8 CMH2O    Instructions:   Patient advised to avoid sedatives, hypnotics, sedating antihistamines and alcohol until CPAP titration is completed and therapy  initiated., Reviewed raw sleep data and agree with therapeutic directions outlined in report., The natural history of obstructive sleep apnea and its tendency to be associated with cardiac, pulmonary, neuropsychiatric discussed in detail with patient., The etiology of OSA was discussed in detail with patient, Driving while drowsy was discussed in detail, Reviews of trustworthy websites recommended, All benefits of CPAP/BiPAP discussed in detail, Advised to call if any difficulties or questions regarding CPAP/BiPAP device, Positional Therapy may be helpful in snoring, and Nasal steroids may be helpful for primary snoring    Impression  Good Sleep Efficiency   CPAP 8 CMH2O  Large Dreamwear FFM  Follow up within 45-90 days    Susa Loffler, CRT    ATTENDING PHYSICIAN ADDENDUM    ABOVE SLEEP DIAGNOSTICS REVIEWED PERSONALLY  PATIENT WITH SLEEP APNEA ON TESTING.  WILL PLAN CPAP 8 CM PRESSURE.    FOLLOW UP TO DISCUSS RESULTS OF STUDY AND RESPONSE TO TREATMENT.    Regenia Skeeter, MD  02/25/2023 21:16

## 2023-02-25 NOTE — Telephone Encounter (Signed)
Patient scheduled and confirmed.

## 2023-02-25 NOTE — Telephone Encounter (Signed)
Pt will need a sleep follow up within 45-90 days from study date 4/29. Thank you! Susa Loffler, CRT

## 2023-02-26 ENCOUNTER — Other Ambulatory Visit (INDEPENDENT_AMBULATORY_CARE_PROVIDER_SITE_OTHER): Payer: Self-pay | Admitting: PULMONARY DISEASE

## 2023-02-26 DIAGNOSIS — G4733 Obstructive sleep apnea (adult) (pediatric): Secondary | ICD-10-CM

## 2023-03-13 ENCOUNTER — Ambulatory Visit (INDEPENDENT_AMBULATORY_CARE_PROVIDER_SITE_OTHER): Payer: Self-pay | Admitting: NURSE PRACTITIONER

## 2023-04-22 ENCOUNTER — Other Ambulatory Visit: Payer: Self-pay

## 2023-04-22 ENCOUNTER — Ambulatory Visit: Payer: Medicare Other | Attending: NURSE PRACTITIONER | Admitting: NURSE PRACTITIONER

## 2023-04-22 ENCOUNTER — Encounter (INDEPENDENT_AMBULATORY_CARE_PROVIDER_SITE_OTHER): Payer: Self-pay | Admitting: NURSE PRACTITIONER

## 2023-04-22 VITALS — BP 142/75 | HR 89 | Temp 97.2°F | Resp 16 | Ht 68.0 in | Wt 218.0 lb

## 2023-04-22 DIAGNOSIS — F17220 Nicotine dependence, chewing tobacco, uncomplicated: Secondary | ICD-10-CM

## 2023-04-22 DIAGNOSIS — I10 Essential (primary) hypertension: Secondary | ICD-10-CM | POA: Insufficient documentation

## 2023-04-22 DIAGNOSIS — R079 Chest pain, unspecified: Secondary | ICD-10-CM | POA: Insufficient documentation

## 2023-04-22 DIAGNOSIS — R9389 Abnormal findings on diagnostic imaging of other specified body structures: Secondary | ICD-10-CM | POA: Insufficient documentation

## 2023-04-22 DIAGNOSIS — Z8249 Family history of ischemic heart disease and other diseases of the circulatory system: Secondary | ICD-10-CM

## 2023-04-22 DIAGNOSIS — I2510 Atherosclerotic heart disease of native coronary artery without angina pectoris: Secondary | ICD-10-CM | POA: Insufficient documentation

## 2023-04-22 DIAGNOSIS — R0609 Other forms of dyspnea: Secondary | ICD-10-CM

## 2023-04-22 DIAGNOSIS — R0789 Other chest pain: Secondary | ICD-10-CM

## 2023-04-22 NOTE — Progress Notes (Signed)
Cardiology [facility]  [address]  [place] [address]  [phone]    Cardiology  Clinic Note    Name: [facility]   DOB: [birth year]  [72 y.o. male]   MRN: [id number]       Visit Date: 04/22/2023   Referring: Joel Bump, FNP-C  [address]  [place],  [place] [address]   PCP: No Pcp         Chief Complaint: New Patient (Pt has a c/o pain along his side when working on stuff with his hands. KH )      History of Present Illness   Joel Weber is a 72 y.o. White male who presents as a referral from [facility] to establish care with Dr. Darron [facility] for coronary artery calcifications noted on a chest CT on 12/05/2022 while at [facility].  PMH includes obstructive sleep apnea, hypothyroid, mitral valve prolapse, dm II, chronic obstructive [facility] disease, celiac disease, and tobacco abuse.  He uses 2 cans of smokeless tobacco per day for approximately 50 years.  He does not wear a CPAP for his sleep apnea.    He was hospitalized at [facility] for dyspnea and hypoxia in February 2024.  Chest CT was done at that time as well as an echocardiogram that showed normal EF.  He denies any dyspnea and states he is doing much better since his hospitalization earlier this year.  He has been walking almost daily since February and is down approximately 21 lb.  He states he has a pulled muscle sensation that is under his left chest and wraps around to his left flank.  States this has been present for quite some time and was possibly present when he had a stress test in 2023.  Reports it was worsening and relieves with rest.  It is present with exertion.  He has no associated symptoms such as diaphoresis or dizziness.  He reports intermittent palpitations and has PVCs noted on his EKG.  He was noted to have PVCs and tachycardia at his hospitalization as well.  He does not take his atenolol daily as he states it  drops his blood pressure too much but if he notices his palpitations are bothersome, he will take it.    He has a history of mitral valve prolapse and followed with Dr. Candelaria Weber in the past.  He had LHC on 03/22/1998 by Dr. Hyacinth [facility] that showed all vessels patent with mild plaquing.  LV-gram was normal.  He states he had a LHC in or around 2009 while living in .  He states he had mild disease of 10%.  He has not on a statin as he had cramping on a high dose of Crestor in the past.  States he takes honey and vinegar for his hyperlipidemia which has controlled it very well.  He is unsure if he has had a recent FLP.    He is significant family history as all 3 of his brothers died of a myocardial infarction.  He states they were aged 1, 41, 20 when this occurred.  His father died of a myocardial infarction at age 66.     Patient Active Problem List    Diagnosis Date Noted    Abnormal CT scan 04/22/2023    OSA (obstructive sleep apnea) 02/03/2023    Dyspnea on exertion 02/03/2023    Coronary artery disease 02/03/2023    Allergic rhinitis 02/03/2023  Allergies  Allergies   Allergen Reactions    Duloxetine Anaphylaxis and Hives/ Urticaria    Wheat      Other Reaction(s): Abdominal Pain    Ampicillin     Carbamazepine Hives/ Urticaria    Penicillin     Carbapenems     Celecoxib     Milk     Barium Sulfate Hives/ Urticaria       Medications    Current Outpatient Medications:     ACCU-CHEK FASTCLIX LANCET DRUM Does not apply Misc, TEST BLOOD SUGAR ONCE DAILY, Disp: , Rfl:     ACCU-CHEK GUIDE TEST STRIPS Does not apply Strip, TEST BLOOD SUGAR ONCE DAILY, Disp: , Rfl:     albuterol sulfate (PROVENTIL OR VENTOLIN OR PROAIR) 90 mcg/actuation Inhalation oral inhaler, 2 puffs Inhalation every 4-6 hrs As needed, Disp: , Rfl:     albuterol sulfate (PROVENTIL) 2.5 mg /3 mL (0.083 %) Inhalation nebulizer solution, 3 mL as needed Inhalation every 6 hrs, Disp: , Rfl:     atenoloL (TENORMIN) 25 mg Oral Tablet, 1  Tablet (25 mg total), Disp: , Rfl:     azelastine (ASTELIN) 137 mcg (0.1 %) Nasal Aerosol, Spray, 1 Spray, Disp: , Rfl:     dapagliflozin propanediol (FARXIGA) 10 mg Oral Tablet, Oral, Disp: , Rfl:     fluticasone propionate (FLONASE) 50 mcg/actuation Nasal Spray, Suspension, USE 1 SPRAY INTO EACH NOSTRIL ONCE DAILY, Disp: , Rfl:     levothyroxine (SYNTHROID) 88 mcg Oral Tablet, 1 tablet in the morning on an empty stomach Oral Once a day for 30 days, Disp: , Rfl:     loperamide (IMODIUM) 2 mg Oral Capsule, TAKE ONE TABLET BY MOUTH FOUR TIMES DAILY AS NEEDED, Disp: , Rfl:     loratadine (CLARITIN) 10 mg Oral Tablet, Take 1 Tablet (10 mg total) by mouth Once a day, Disp: , Rfl:     LYRICA 150 mg Oral Capsule, Take 1 Capsule (150 mg total) by mouth Three times a day, Disp: , Rfl:     pantoprazole (PROTONIX) 40 mg Oral Tablet, Delayed Release (E.C.), Take 1 Tablet (40 mg total) by mouth Once a day, Disp: , Rfl:     tamsulosin (FLOMAX) 0.4 mg Oral Capsule, TAKE ONE CAPSULE BY MOUTH TWICE DAILY FOR KIDNEY STONE, Disp: , Rfl:     History  Past Medical History:   Diagnosis Date    Cough     Diabetes mellitus (CMS HCC)     Hypothyroidism     Shortness of breath     Sleep apnea          Past Surgical History:   Procedure Laterality Date    CARDIAC CATHETERIZATION      HX APPENDECTOMY      HX BACK SURGERY      X7    HX CHOLECYSTECTOMY      HX COLONOSCOPY      HX HEART VALVE SURGERY       Social History     Socioeconomic History    Marital status: Married   Tobacco Use    Smoking status: Never    Smokeless tobacco: Current     Types: Snuff   Vaping Use    Vaping status: Never Used   Substance and Sexual Activity    Alcohol use: Never    Drug use: Never     Family Medical History:       Problem Relation (Age of Onset)    Asthma Father  COPD Father    Cancer Mother, Sister    Congestive Heart Failure Father    Diabetes Mother    Heart Attack Father, Brother    Heart Disease Brother    Hypertension (High Blood Pressure) Brother               Review of Systems:  Ten point ROS obtained from patient and negative other than what is specified in HPI.    Physical Examination:  BP (!) 142/75 (Site: Left, Patient Position: Sitting, Cuff Size: Adult)   Pulse 89   Temp 36.2 C (97.2 F) (Temporal)   Resp 16   Ht 1.727 m (5\' 8" )   Wt 98.9 kg (218 lb 0.6 oz)   SpO2 97%   BMI 33.15 kg/m       General: Awake. Well nourished. No acute distress. A&O x 3.  HEENT: Normocephalic, atraumatic. PERRL. Normal hearing.  Neck: No JVD or bruit. Trachea midline.  Lungs: Clear to auscultation. No rhonchi or wheezing noted. Unlabored.  Heart: Regular rate and rhythm. No murmurs, rubs, or gallops are appreciated.  Abdomen: BS normal x 4 quadrants. Soft, non-tender, non-distended.  Extremities: No clubbing, cyanosis, or edema.  Psychiatric: Cooperative, appropriate mood and affect.  Musculoskeletal: Strength grips are equal. No red, swollen joints. MAE.  Skin: Warm, dry, and intact. No rashes or hives are noted.     Diagnostics  ECG:  To be reviewed by Dr [facility]      Echo:  12/06/2022    Left ventricle: The cavity size is normal. Systolic function is normal. The estimated ejection fraction is 5560%. Wall  motion is normal? there are no regional wall motion abnormalities. There is diastolic dysfunction.  Right ventricle: The cavity size is normal. Systolic function is normal.  Left atrium: The atrium is normal in size.  Right atrium: The atrium is normal in size.  Aortic valve: The valve is probably trileaflet. There is no stenosis. There is no regurgitation.  Mitral valve: There is no evidence for stenosis. There is physiologic regurgitation. Anterior mitral leaflet has "hockey stick"appearance.  Tricuspid valve: There is physiologic regurgitation.  Pulmonic valve: There is trivial regurgitation.  Aorta and systemic arteries:  Ascending aorta: The ascending aorta is normalsized.  Systemic veins:  Inferior vena cava: The IVC is dilated. Respirophasic diameter changes  are blunted (< 50%).  Pericardium: A pericardial effusion cannot be excluded.  Summary:   1. Left ventricle: The cavity size is normal. Systolic function is normal. The estimated ejection fraction is 5560%. Wall  motion is normal? there are no regional wall motion abnormalities.   2. A pericardial effusion cannot be excluded.        Orders Placed This Encounter    CTA HEART CORONARY    ECG 12 Lead w/ Interp ([facility] - In Clinic, Same Day)    7 DAY (HOME ENROLLMENT) EXTENDED HOLTER MONITOR Adult       Medications Discontinued During This Encounter   Medication Reason    ergocalciferol, vitamin D2, (DRISDOL) 1,250 mcg (50,000 unit) Oral Capsule Patient states no longer taking    finasteride (PROSCAR) 5 mg Oral Tablet Patient states no longer taking    hydrOXYzine HCL (ATARAX) 25 mg Oral Tablet Patient states no longer taking    LANTUS U-100 INSULIN 100 unit/mL Subcutaneous injection (vial) Patient states no longer taking    meloxicam (MOBIC) 15 mg Oral Tablet Patient states no longer taking    MetFORMIN (GLUCOPHAGE) 1,000 mg Oral Tablet Patient states no  longer taking    metoclopramide HCl (REGLAN) 5 mg Oral Tablet Patient states no longer taking    naloxone (NARCAN) 4 mg per spray nasal spray Patient states no longer taking    Sildenafil (VIAGRA) 100 mg Oral Tablet Patient states no longer taking       Assessment and Plan:  Assessment/Plan   1. Abnormal CT scan    2. Dyspnea on exertion    3. Coronary artery disease    4. Essential hypertension    5. Chest pain, unspecified type        PLAN:  I have recommended coronary CTA for further evaluation of left-sided chest pain.  He reports a normal stress test last year but I do not have a copy of this at time of visit.  We will try to request this.  He is significant family history as well as type 2 DM, untreated hyperlipidemia, tobacco abuse, strong family history.  I will also order a Holter monitor for 7 days to evaluate PVC burden and heart rate control as he states he  isn't able to tolerate atenolol daily for palpitations.  He may do better switching to low-dose Toprol which would affect his blood pressure less.  He states his allergist draws and FLP every 3 months.  I will see if we can obtain results of this and make recommendations for statin if needed.  He is educated to go to the ER if his symptoms change in character or intensity.  He will return in 6 months and sooner if needed.      Follow up:  Return in about 6 months (around 10/22/2023).        Estrella Deeds, NP  [facility]  Cardiology  [facility]    A portion of this documentation may have been generated using [facility] voice recognition software and may contain syntax/voice recognition errors.

## 2023-04-23 ENCOUNTER — Inpatient Hospital Stay (INDEPENDENT_AMBULATORY_CARE_PROVIDER_SITE_OTHER)
Admission: RE | Admit: 2023-04-23 | Discharge: 2023-04-23 | Disposition: A | Discharge status: Home / Self Care | Payer: Medicare Other | Source: Ambulatory Visit

## 2023-04-23 DIAGNOSIS — I10 Essential (primary) hypertension: Secondary | ICD-10-CM

## 2023-04-23 DIAGNOSIS — R9389 Abnormal findings on diagnostic imaging of other specified body structures: Secondary | ICD-10-CM

## 2023-04-23 DIAGNOSIS — I2510 Atherosclerotic heart disease of native coronary artery without angina pectoris: Secondary | ICD-10-CM

## 2023-04-23 DIAGNOSIS — R0609 Other forms of dyspnea: Secondary | ICD-10-CM

## 2023-05-02 DIAGNOSIS — I21A9 Other myocardial infarction type: Secondary | ICD-10-CM

## 2023-05-02 DIAGNOSIS — R9431 Abnormal electrocardiogram [ECG] [EKG]: Secondary | ICD-10-CM

## 2023-05-02 DIAGNOSIS — I440 Atrioventricular block, first degree: Secondary | ICD-10-CM

## 2023-05-02 DIAGNOSIS — I493 Ventricular premature depolarization: Secondary | ICD-10-CM

## 2023-05-02 LAB — ECG 12 LEAD W/ INTERP (AMB USE ONLY) (MUSE, IN CLINC) (93005/93010)
Atrial Rate: 89 {beats}/min
Calculated P Axis: 37 degrees
Calculated R Axis: 91 degrees
Calculated T Axis: -12 degrees
PR Interval: 226 ms
QRS Duration: 94 ms
QT Interval: 366 ms
QTC Calculation: 445 ms
Ventricular rate: 89 {beats}/min

## 2023-05-09 ENCOUNTER — Ambulatory Visit: Payer: Medicare Other | Attending: NURSE PRACTITIONER | Admitting: NURSE PRACTITIONER

## 2023-05-09 ENCOUNTER — Other Ambulatory Visit (INDEPENDENT_AMBULATORY_CARE_PROVIDER_SITE_OTHER): Payer: Self-pay | Admitting: PULMONARY DISEASE

## 2023-05-09 ENCOUNTER — Telehealth (INDEPENDENT_AMBULATORY_CARE_PROVIDER_SITE_OTHER): Payer: Self-pay | Admitting: NURSE PRACTITIONER

## 2023-05-09 ENCOUNTER — Other Ambulatory Visit: Payer: Self-pay

## 2023-05-09 ENCOUNTER — Encounter (INDEPENDENT_AMBULATORY_CARE_PROVIDER_SITE_OTHER): Payer: Self-pay | Admitting: NURSE PRACTITIONER

## 2023-05-09 VITALS — BP 139/70 | HR 82 | Temp 96.9°F | Resp 18 | Ht 68.0 in | Wt 218.0 lb

## 2023-05-09 DIAGNOSIS — I2510 Atherosclerotic heart disease of native coronary artery without angina pectoris: Secondary | ICD-10-CM | POA: Insufficient documentation

## 2023-05-09 DIAGNOSIS — J309 Allergic rhinitis, unspecified: Secondary | ICD-10-CM | POA: Insufficient documentation

## 2023-05-09 DIAGNOSIS — G4733 Obstructive sleep apnea (adult) (pediatric): Secondary | ICD-10-CM | POA: Insufficient documentation

## 2023-05-09 DIAGNOSIS — R0609 Other forms of dyspnea: Secondary | ICD-10-CM | POA: Insufficient documentation

## 2023-05-09 NOTE — Nursing Note (Signed)
Epworth Scale Score:  Score total: 0    Are you currently on CPAP/BIPAP/TRILOGY CPAP  DME Company Information: [employer]  Has previous OSA symptoms resolved with CPAP/BIPAP/TRILOGY?Yes  Download available? No, Reason: PT FORGOT  Currently experiencing any additional sleep related issues?No  Oxygen therapy?  No  DME Company Information: N/A  What Liter Flow is the patient on? N/A  Have you received a flu shot? No  Have you received a pneumonia shot? No  Smoking History:    Social History     Tobacco Use   Smoking Status Never   Smokeless Tobacco Current     Have you had any recent hospitalizations?  No  Has the patient had any tests performed since the last visit? None  If so, where were the test(s) performed? N/A  Does the patient have any other associated symptoms or modifying factors? None

## 2023-05-09 NOTE — Telephone Encounter (Signed)
Can we please request recent download from patient's DME company. Thank you!

## 2023-05-09 NOTE — Progress Notes (Signed)
[facility]  [address]  [place] [address]  Operated by [facility]     Follow up/Progress Note    Patient Name: [facility]  Date: 05/09/2023  Department:  [facility]  MRN: [id number]  DOB: [birth year]  Primary Care Provider:  No Pcp  Referring Provider:  No ref. provider found    Chief Complaint:   Chief Complaint   Patient presents with    Sleep Apnea     Nursing Notes:   Vickey Huger, [place]  05/09/23 1025  Signed  Epworth Scale Score:  Score total: 0    Are you currently on CPAP/BIPAP/TRILOGY CPAP  DME Company Information: [employer]  Has previous OSA symptoms resolved with CPAP/BIPAP/TRILOGY?Yes  Download available? No, Reason: PT FORGOT  Currently experiencing any additional sleep related issues?No  Oxygen therapy?  No  DME Company Information: N/A  What Liter Flow is the patient on? N/A  Have you received a flu shot? No  Have you received a pneumonia shot? No  Smoking History:    Social History     Tobacco Use   Smoking Status Never   Smokeless Tobacco Current     Have you had any recent hospitalizations?  No  Has the patient had any tests performed since the last visit? None  If so, where were the test(s) performed? N/A  Does the patient have any other associated symptoms or modifying factors? None    HPI:   Patient with a history of OSA, mitral valve prolapse, diabetes, hypothyroidism, and celiac disease presents today for follow up. He was last seen in the office as a new hospital follow up on 02/03/2023.     He was admitted to [facility] on 12/05/2022 and presented to the ER for shortness of breath that had been ongoing for 3-4 weeks. Prior to this he reported he had been treated at outlying facility for COPD exacerbation. CT chest was negative for acute findings, but did show CAD. Echo showed some diastolic dysfunction. Shortness of breath resolved and patient was recommended to follow up  in office with PFTs.     He reports he was recently diagnosed with COPD by his PCP, but had never had PFT's or seen a pulmonologist. PFT tracing last visit showed no obstruction or restriction and were essentially normal. He was having worsening shortness of breath several months ago, however, since hospitalization he has been walking 2 miles every day and has lost about 20 lbs and shortness of breath has mostly resolved. We referred him to Cardiology last visit for evaluation CAD and history of mitral valve prolapse. He reports he is getting an evaluation at Dr. Theodora Blow office.      He takes Astelin and Claritin for allergies, but does not always take these regularly.     Patient was previously diagnosed with OSA at [facility] but had difficulty tolerating CPAP device and quit.  His initial sleep study on 07/27/2018 showed an AHI of 47.8.  He was agreeable to titration study and resume CPAP. He was retitrated on 02/24/2023 to 8 cm H2O. Since starting CPAP, he states he has been doing better tolerating therapy. No available download for review today.     He is a lifelong nonsmoker, but did have secondhand smoke exposure. 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     Past Medical History:  Past Medical History:   Diagnosis Date    Cough     Diabetes mellitus (CMS HCC)     Hypothyroidism     Shortness of breath     Sleep apnea      Past Surgical History  Past Surgical History:   Procedure Laterality Date    CARDIAC CATHETERIZATION      HX APPENDECTOMY      HX BACK SURGERY      X7    HX CHOLECYSTECTOMY      HX COLONOSCOPY      HX HEART VALVE SURGERY       Medication List  Current Outpatient Medications   Medication Sig    ACCU-CHEK FASTCLIX LANCET DRUM Does not apply Misc TEST BLOOD SUGAR ONCE DAILY    ACCU-CHEK GUIDE TEST STRIPS Does not apply Strip TEST BLOOD SUGAR ONCE DAILY    albuterol sulfate (PROVENTIL OR VENTOLIN OR PROAIR) 90  mcg/actuation Inhalation oral inhaler 2 puffs Inhalation every 4-6 hrs  As needed    albuterol sulfate (PROVENTIL) 2.5 mg /3 mL (0.083 %) Inhalation nebulizer solution 3 mL as needed Inhalation every 6 hrs    atenoloL (TENORMIN) 25 mg Oral Tablet 1 Tablet (25 mg total)    azelastine (ASTELIN) 137 mcg (0.1 %) Nasal Aerosol, Spray 1 Spray    dapagliflozin propanediol (FARXIGA) 10 mg Oral Tablet Oral    fluticasone propionate (FLONASE) 50 mcg/actuation Nasal Spray, Suspension USE 1 SPRAY INTO EACH NOSTRIL ONCE DAILY    levothyroxine (SYNTHROID) 88 mcg Oral Tablet 1 tablet in the morning on an empty stomach Oral Once a day for 30 days    loperamide (IMODIUM) 2 mg Oral Capsule TAKE ONE TABLET BY MOUTH FOUR TIMES DAILY AS NEEDED    loratadine (CLARITIN) 10 mg Oral Tablet Take 1 Tablet (10 mg total) by mouth Once a day    LYRICA 150 mg Oral Capsule Take 1 Capsule (150 mg total) by mouth Three times a day    pantoprazole (PROTONIX) 40 mg Oral Tablet, Delayed Release (E.C.) Take 1 Tablet (40 mg total) by mouth Once a day    potassium chloride (K-DUR) 20 mEq Oral Tab Sust.Rel. Particle/Crystal Take 1 Tablet (20 mEq total) by mouth Once a day    tadalafil (CIALIS) 5 mg Oral Tablet TAKE ONE TABLET daily AT THE same time every DAY    tamsulosin (FLOMAX) 0.4 mg Oral Capsule TAKE ONE CAPSULE BY MOUTH TWICE DAILY FOR KIDNEY STONE    trimethoprim-sulfamethoxazole (BACTRIM DS) 160-800mg  per tablet TAKE ONE TABLET BY MOUTH TWICE DAILY drink plenty of fluids     Allergy List  Allergy History as of 05/09/23       WHEAT         Noted Status Severity Type Reaction    02/03/23 0918 Copen, Lucinda, MA 11/08/21 Active High      Comments: Other Reaction(s): Abdominal Pain               AMPICILLIN         Noted Status Severity Type Reaction    02/03/23 0918 Copen, Lucinda, MA 02/03/23 Active Medium                CARBAMAZEPINE         Noted Status Severity Type Reaction    02/03/23 0918 Copen, Lucinda, MA 12/17/22 Active Medium  Hives/  Urticaria              DULOXETINE  Noted Status Severity Type Reaction    02/03/23 0918 Copen, [place], MA 12/17/22 Active High  Anaphylaxis, Hives/ Urticaria              PENICILLIN         Noted Status Severity Type Reaction    02/03/23 0918 Copen, Lucinda, MA 02/03/23 Active Medium                CARBAPENEMS         Noted Status Severity Type Reaction    02/03/23 0918 Copen, Lucinda, MA 11/08/21 Active                 CELECOXIB         Noted Status Severity Type Reaction    02/03/23 0918 Copen, Lucinda, MA 11/08/21 Active                 MILK         Noted Status Severity Type Reaction    02/03/23 0918 Copen, Lucinda, MA 11/08/21 Active                 BARIUM SULFATE         Noted Status Severity Type Reaction    02/03/23 0918 Copen, Lucinda, MA 02/03/23 Active Low  Hives/ Urticaria                  Family History   Family Medical History:       Problem Relation (Age of Onset)    Asthma Father    COPD Father    Cancer Mother, Sister    Congestive Heart Failure Father    Diabetes Mother    Heart Attack Father, Brother    Heart Disease Brother    Hypertension (High Blood Pressure) Brother            Social History  Social History     Socioeconomic History    Marital status: Married   Tobacco Use    Smoking status: Never    Smokeless tobacco: Current     Types: Snuff   Vaping Use    Vaping status: Never Used   Substance and Sexual Activity    Alcohol use: Never    Drug use: Never        Review of system  General:  Denies fever, chills, night sweats, fatigue, loss of appetite.  Neurological:  Denies headache, snoring.   Gastrointestinal:  Denies reflux, heartburn, diarrhea.  Cardiovascular:  Denies chest pain, irregular heartbeats.  Respiratory:  See HPI.  Musculoskeletal:  Denies joint pain, restless legs.  Endocrine/Metabolic:  Denies weight gain, weight loss.  Immunologic:  Denies sinus allergy symptoms.  Mental Status/Psychiatric:  Denies anxiety, depression.  ENT:  Denies nasal congestion, hoarseness,  postnasal drip.  Integumentary:  Denies rash, new skin lesions.    Objective:  Vital Signs  Vitals:    05/09/23 1010   BP: 139/70   Pulse: 82   Resp: 18   Temp: 36.1 C (96.9 F)   SpO2: 98%   Weight: 98.9 kg (218 lb)   Height: 1.727 m (5\' 8" )   BMI: 33.22     PHYSICAL EXAMINATION:   Constitutional:  Vital signs stable.  General appearance of the patient:  Alert, no acute distress.  Normal appearance, well nourished.  Eyes: PERRLA and normal eye lids.  Conjunctiva normal.  Ears, Nose, Mouth, and Throat: External inspection of ears and nose with normal appearance.  Inspection of lips, teeth and gums with normal  appearance and oral mucosa normal.  Neck: Supple with trachea midline, non tender, no nodules, no masses, gland position midline.  Respiratory:  Auscultation of lungs with normal breath sounds, no rales, no rhonchi, no wheezing. Respiratory effort with no tractions, breathing regular and unlabored.  Cardiovascular: Regular rhythm and regular rate. No murmur, no peripheral edema.  Gastrointestinal: Abdomen non-tender, no masses, no hepatomegaly present.  Musculoskeletal:  Normal gait and station, normal digits, no digital cyanosis or clubbing.  Mental Status/Psychiatric:  Alert, grossly oriented to person, place, and time.  Appropriate and normal mood.    Assessment    ICD-10-CM    1. OSA (obstructive sleep apnea)  G47.33       2. Dyspnea on exertion  R06.09       3. Coronary artery disease  I25.10       4. Allergic rhinitis  J30.9         Plan  Instructions  Continue medications as prescribed/directed unless changed by provider.  Plan of care discussed with patient.    Patient's shortness of breath has significantly improved since increasing his activity. Patient's PFTs today were WNL. Continue to increase activity as tolerated. Continue Astelin and Claritin daily for allergic rhinitis.    Continue current CPAP settings. Strongly encouraged nightly CPAP compliance. We will request recent download from  patient's DME company.    Continue evaluation per Cardiology.    Return to clinic in 28 weeks or sooner if needed. Patient was personally seen and examined.    Return in about 28 weeks (around 11/21/2023) for In Dr. Tommye Standard pod.    The patient was given the opportunity to ask questions and those questions were answered to the patient's satisfaction. The patient was encouraged to call with any additional questions or concerns. Discussed with the patient effects and side effects of medications. Medication safety was discussed.  The patient was informed to contact the office within 7 business days if a message/lab results/referral/imaging results have not been conveyed to the patient.  Electronically signed by Harmon Pier, FNP-BC    This note may have been partially generated using M[employer] Fluency Direct system, and there may be some incorrect words, spellings, and punctuation that were not noted in checking the note before saving.

## 2023-05-20 DIAGNOSIS — I10 Essential (primary) hypertension: Secondary | ICD-10-CM

## 2023-05-20 DIAGNOSIS — R0609 Other forms of dyspnea: Secondary | ICD-10-CM

## 2023-05-20 DIAGNOSIS — R9389 Abnormal findings on diagnostic imaging of other specified body structures: Secondary | ICD-10-CM

## 2023-05-20 DIAGNOSIS — I2510 Atherosclerotic heart disease of native coronary artery without angina pectoris: Secondary | ICD-10-CM

## 2023-05-20 LAB — 7 DAY (HOME ENROLLMENT) EXTENDED HOLTER MONITOR
Heart rate (average): 86 {beats}/min
Isolated SVE count: 594 episodes
Isolated VE Counts: 9963 episodes
Longest bigeminy - duration: 5.5 s
Longest supraventricular tachycardia episode - duration: 20.7 s
Longest supraventricular tachycardia episode - heart rate (: 141 {beats}/min
Longest supraventricular tachycardia episode - number of be: 48 beats
Longest trigeminy - duration: 39.3 s
Longest ventricular tachycardia episode - duration: 2.8
Longest ventricular tachycardia episode - heart rate (avera: 156
Longest ventricular tachycardia episode - number of beats: 6 beats
SVE Couplets Counts: 23 episodes
Supraventricular tachycardia - heart rate (average): 110 {beats}/min
Supraventricular tachycardia - number of episodes: 22
Supraventricular tachycardia with fastest heart rate - dura: 20.7 s
Supraventricular tachycardia with fastest heart rate - hear: 141 {beats}/min
Supraventricular tachycardia with fastest heart rate - numb: 48 beats
VE Couplets Counts: 660 episodes
VE Triplets Counts: 34 episodes
Ventricular tachycardia - heart rate (average): 136 {beats}/min
Ventricular tachycardia - number of episodes: 5
Ventricular tachycardia with fastest heart rate - duration: 2.8 s
Ventricular tachycardia with fastest heart rate - heart rat: 156
Ventricular tachycardia with fastest heart rate - number of: 6 beats

## 2023-05-21 ENCOUNTER — Telehealth (INDEPENDENT_AMBULATORY_CARE_PROVIDER_SITE_OTHER): Payer: Self-pay | Admitting: NURSE PRACTITIONER

## 2023-05-21 NOTE — Telephone Encounter (Signed)
Attempted to contact patient to discuss Zio monitor results.    05/21/23 1649 - no answer, no voicemail

## 2023-05-26 ENCOUNTER — Telehealth (INDEPENDENT_AMBULATORY_CARE_PROVIDER_SITE_OTHER): Payer: Self-pay | Admitting: NURSE PRACTITIONER

## 2023-05-26 NOTE — Telephone Encounter (Signed)
Phone call with patient to discuss Zio monitor results.    Patient had a min HR of 50 bpm, max HR of 187 bpm, and avg HR of 86 bpm. Predominant underlying rhythm was Sinus Rhythm. 5 Ventricular Tachycardia runs occurred, the run with the fastest interval lasting 6 beats with a max rate of 187 bpm (avg 156 bpm); the run with the fastest interval was also the longest. 22 Supraventricular Tachycardia runs occurred, the run with the fastest interval lasting 20.7 secs with a max rate of 164 bpm (avg 141 bpm); the run with the fastest interval was also the longest. Isolated SVEs were rare (<1.0%), SVE Couplets were rare (<1.0%), and no SVE Triplets were present. Isolated VEs were occasional (1.6%, 9963), VE Couplets were rare (<1.0%, 660), and VE Triplets were rare (<1.0%, 34). Ventricular Bigeminy and Trigeminy were present. MD notification criteria for Ventricular Tachycardia met - report posted prior to notification per account request (RS).  Conclusion:  The lowest heart rate was sinus bradycardia with a heart rate of 50 beats per minute.  There were occasional runs of nonsustained ventricular tachycardia not more than 6 beats at a time.  Occasional supraventricular tachycardia is noted.  Isolated PVCs and ventricular couplets were seen.  There is no evidence of atrial fibrillation.    Patient verbalized understanding. Pending CCTA on 05/28/23. F/U scheduled 10/24/23.

## 2023-05-28 ENCOUNTER — Other Ambulatory Visit: Payer: Self-pay

## 2023-05-28 ENCOUNTER — Inpatient Hospital Stay
Admission: RE | Admit: 2023-05-28 | Discharge: 2023-05-28 | Disposition: A | Discharge status: Home / Self Care | Payer: Medicare Other | Source: Ambulatory Visit | Attending: NURSE PRACTITIONER | Admitting: NURSE PRACTITIONER

## 2023-05-28 DIAGNOSIS — R0609 Other forms of dyspnea: Secondary | ICD-10-CM | POA: Insufficient documentation

## 2023-05-28 DIAGNOSIS — I2510 Atherosclerotic heart disease of native coronary artery without angina pectoris: Secondary | ICD-10-CM | POA: Insufficient documentation

## 2023-05-28 DIAGNOSIS — R9389 Abnormal findings on diagnostic imaging of other specified body structures: Secondary | ICD-10-CM | POA: Insufficient documentation

## 2023-05-28 DIAGNOSIS — I10 Essential (primary) hypertension: Secondary | ICD-10-CM | POA: Insufficient documentation

## 2023-05-28 DIAGNOSIS — R079 Chest pain, unspecified: Secondary | ICD-10-CM

## 2023-05-28 MED ORDER — NITROGLYCERIN 0.4 MG SUBLINGUAL TABLET
0.4000 mg | SUBLINGUAL_TABLET | Freq: Once | SUBLINGUAL | Status: AC
Start: 2023-05-28 — End: 2023-05-28
  Administered 2023-05-28: 0.4 mg via SUBLINGUAL
  Filled 2023-05-28: qty 1

## 2023-05-28 MED ORDER — IOPAMIDOL 370 MG IODINE/ML (76 %) INTRAVENOUS SOLUTION
80.0000 mL | INTRAVENOUS | Status: AC
Start: 2023-05-28 — End: 2023-05-28
  Administered 2023-05-28: 80 mL via INTRAVENOUS

## 2023-05-28 MED ORDER — METOPROLOL TARTRATE 100 MG TABLET
100.0000 mg | ORAL_TABLET | Freq: Once | ORAL | Status: AC
Start: 2023-05-28 — End: 2023-05-28
  Administered 2023-05-28: 100 mg via ORAL
  Filled 2023-05-28: qty 1

## 2023-06-02 NOTE — Progress Notes (Signed)
Joel Weber  DOB: 1951-04-22  MR: [id number]    Subjective  Patient ID: Joel Weber is a 72 y.o. male who presents for Pain of the Left Knee.    HPI  Patient here today for left knee pain. The pain is at a severity of 8. The pain is described as aching and dull. Aggravating factors include standing, walking, going up stairs, going down stairs, and squatting. Associated symptoms are popping. Relieving factors include rest. Patient has tried NSAIDs, ice, heat, and activity modification. Patient has also tried 12 weeks of home therapy with no relief.      Pain Scale: 8/10  Pt states pain in left knee for some time now. Pt states medial knee pain increases with walking and standing. Relieved with rest. There is pain and stiffness after rest and return to activity. Has had relief with previous steroid injection.  Patient states he had initially scheduled a knee replacement but has decided to hold off on this until sometime in the winter.  Review of Systems   Constitutional:  Negative for chills and fever.   Respiratory:  Negative for shortness of breath.    Cardiovascular:  Negative for chest pain.   Gastrointestinal:  Negative for nausea.   Skin:  Negative for rash.        Vitals:    06/02/23 0830   BP: 121/62   Pulse: 86   Resp: 18         Current Outpatient Medications:   .  atenolol (Tenormin) 25 MG tablet, Take by mouth in the morning., Disp: , Rfl:   .  azelastine (Astelin) 0.1 % nasal spray, Administer 1 spray into each nostril in the morning and at bedtime. Use in each nostril as directed, Disp: , Rfl:   .  levothyroxine (Synthroid, Levoxyl) 50 MCG tablet, Take 1 tablet (50 mcg) by mouth before breakfast., Disp: , Rfl:   .  pantoprazole (ProtoNix) 40 MG EC tablet, Take 1 tablet (40 mg) by mouth before breakfast. Do not crush, chew, or split., Disp: , Rfl:   .  pregabalin (Lyrica) 150 MG capsule, Take 1 capsule (150 mg) by mouth in the morning and at bedtime., Disp: , Rfl:     Current Facility-Administered  Medications:   .  methylPREDNISolone acetate (DEPO-Medrol) injection 40 mg, 40 mg, Intramuscular, Once, Arvin Collard, PA    ALLERGIES  Carbapenems, Celecoxib, Milk, and Wheat    HISTORY  Past Medical History:   Diagnosis Date   . Arthritis    . GERD (gastroesophageal reflux disease)    . HTN (hypertension)        Past Surgical History:   Procedure Laterality Date   . APPENDECTOMY     . BACK SURGERY Bilateral     x 7   . CHOLECYSTECTOMY     . CT HEART CORONARY ANGIOGRAM  05/28/2023    CT HEART CORONARY ANGIOGRAM 05/28/2023   . ESOPHAGOGASTRODUODENOSCOPY     . HERNIA REPAIR     . KNEE ARTHROPLASTY Left 2021        History reviewed. No pertinent family history.     Social History     Tobacco Use   Smoking Status Never   Smokeless Tobacco Never        Right Knee Exam     Muscle Strength   The patient has normal right knee strength.    Range of Motion   The patient has normal right knee ROM.      Left  Knee Exam     Tests   McMurray:  Medial - positive Lateral - negative  Drawer:  Anterior - normal     Posterior - normal  Pivot shift: normal             Knee Musculoskeletal Exam  Gait    Limp: left    Inspection    Right      Right knee inspection is normal.        Previous incision: anterior    Left      Erythema: none        Effusion: none        Edema: none        Ecchymosis: none        Deformity: mild        Alignment: varus      Palpation    Right      Right knee palpation is unremarkable.      Left      Crepitus: patellofemoral        Tenderness: present          Lateral joint line: mild          Medial joint line: severe      Range of Motion    Right      Right knee range of motion is normal.      Left      Active extension: 5      Active flexion: 115    Strength    Right      Right knee strength is normal.     Left      Extension: 4+/5. Extension is affected by pain.      Instability    Right      Instability signs: none - stable    Left      Varus stress grade: normal      Valgus stress grade: 1+      Pivot  shift: normal      Anterior drawer: normal      Posterior drawer: normal      Quad active test: normal      Medial McMurray test: positive      Lateral McMurray test: negative      Lachman: negative    Neurovascular    Right      Right knee neurovascular exam is normal.      Left      Left knee neurovascular exam is normal.      General      Constitutional: appears stated age    Psychiatric: no acute distress    Neurological: alert and oriented x3     xray left knee reveals djd and medial joint space narrowing.     Assessment   Diagnosis Plan   1. Primary osteoarthritis of left knee         Patient ID: Joel Weber is a 72 y.o. male.    Visit Vitals  BP 121/62   Pulse 86   Resp 18   Ht 5\' 8"  (1.727 m)   Wt 223 lb (101 kg)   BMI 33.91 kg/m   Smoking Status Never   BSA 2.14 m       L Inj/Asp: L knee on 06/02/2023 8:47 AM  Indications: pain  Details: 21 G needle, anterolateral approach  Medications: 40 mg lidocaine 1 %; 40 mg methylPREDNISolone acetate 40 MG/ML; 25 mg bupivacaine 0.5 %  Outcome: tolerated well, no immediate complications  After discussing treatment options including alternatives, potential benefits and risks (including but not limited to: infection, bleeding, pain, allergic reaction and failure to help with pain). Patient would like to proceed with a knee injection.  Consent was obtained. Using sterile technique the knee was prepped and ethyl chloride spray was used as local anesthetic. The joint was entered and 1 mL Depo-Medrol 40 mg was then injected and the needle withdrawn.  The procedure was well tolerated.  The patient is asked to continue to rest the joint for a few more days before resuming regular activities. Advised on ice and compression as needed and NSAIDS as needed if tolerated.  It may be more painful for the first few days.  Watch for fever, drainage, or increased swelling or worsening pain in the joint. Call or return to clinic prn if such symptoms occur or there is failure to  improve as anticipated.       Procedure, treatment alternatives, risks and benefits explained, specific risks discussed. Consent was given by the patient. Immediately prior to procedure a time out was called to verify the correct patient, procedure, equipment, support staff and site/side marked as required. Patient was prepped and draped in the usual sterile fashion.         Plan   I had a long discussion with the patient. I explained that I do think  their pain is coming from knee arthritis, and at some point they may benefit from total knee arthroplasty.  At this point however, we are going to attempt conservative treatment with corticosteroid injection in the left knee. This was thus performed in office today by Dr Margo Aye without complication.   We will encourage symptomatic care at this time with NSAIDs and HEP.

## 2023-06-04 ENCOUNTER — Other Ambulatory Visit (INDEPENDENT_AMBULATORY_CARE_PROVIDER_SITE_OTHER): Payer: Self-pay | Admitting: CARDIOVASCULAR DISEASE

## 2023-06-04 NOTE — Telephone Encounter (Addendum)
Per NP will try low dose Crestor 5mg  every evening.    Phone call to patient to review CCTA results.  No significant stenosis seen.  Patient is not on any statins.  Was taking Crestor at one point several years ago but it made his leg hurt so he stopped taking it and takes vinegar and honey. He is agreeable to try a statin again.  He is on vacation OOT right now but will talk to Wynona Canes and see what she wants to start patient on. He requested it be sent in to [facility].      FINDINGS:     This is a right dominant system.     RCA: Calcified plaque causes no significant stenosis.     LEFT MAIN CORONARY ARTERY: Normal vessel without significant plaque or stenosis.     LAD: Calcified plaques cause mild stenoses.     LCx: Normal vessel without significant plaque or stenosis.     NONCORONARY CARDIAC FINDINGS:     The heart is normal in size. There are no findings of intracardiac thrombus.     NONCARDIAC FINDINGS:     Foci of scarring and/or atelectasis are noted in the included portions of the lungs. The entire chest was not included on the exam.  ___________________________________  IMPRESSION:  Calcified plaques in the left anterior descending coronary artery cause mild stenoses. Calcified plaque along the right coronary artery causes no significant stenosis.     HeartFlow FFRct is recommended for further analysis of coronary stenoses between 40-90%. Once FFRct has been processed for any such stenoses, an addendum to this report will be issued.

## 2023-07-09 ENCOUNTER — Telehealth (INDEPENDENT_AMBULATORY_CARE_PROVIDER_SITE_OTHER): Payer: Self-pay | Admitting: PHYSICIAN ASSISTANT

## 2023-07-09 NOTE — Telephone Encounter (Signed)
Conducting chart pulls for 07/21/2023.    Mailing pt request letter for SD card/Compliance report for upcoming appt or  if pt n/s or r/s. Checked online resources for compliance and could not find report.     Verlon Au, [place]  07/09/2023  9:30 AM

## 2023-07-16 MED ORDER — ROSUVASTATIN 5 MG TABLET
5.0000 mg | ORAL_TABLET | Freq: Every evening | ORAL | 4 refills | Status: DC
Start: 2023-07-16 — End: 2023-07-22

## 2023-07-16 NOTE — Addendum Note (Signed)
Addended by: Oren Bracket F on: 07/16/2023 01:23 PM     Modules accepted: Orders

## 2023-07-21 ENCOUNTER — Telehealth (INDEPENDENT_AMBULATORY_CARE_PROVIDER_SITE_OTHER): Payer: Self-pay | Admitting: CARDIOVASCULAR DISEASE

## 2023-07-21 ENCOUNTER — Ambulatory Visit (INDEPENDENT_AMBULATORY_CARE_PROVIDER_SITE_OTHER): Payer: Self-pay | Admitting: PHYSICIAN ASSISTANT

## 2023-07-21 ENCOUNTER — Inpatient Hospital Stay (HOSPITAL_BASED_OUTPATIENT_CLINIC_OR_DEPARTMENT_OTHER)
Admission: RE | Admit: 2023-07-21 | Discharge: 2023-07-21 | Disposition: A | Discharge status: Home / Self Care | Payer: Medicare Other | Source: Ambulatory Visit | Attending: PULMONARY DISEASE | Admitting: PULMONARY DISEASE

## 2023-07-21 ENCOUNTER — Other Ambulatory Visit: Payer: Self-pay

## 2023-07-21 ENCOUNTER — Ambulatory Visit: Payer: Medicare Other | Attending: PHYSICIAN ASSISTANT | Admitting: PHYSICIAN ASSISTANT

## 2023-07-21 ENCOUNTER — Encounter (INDEPENDENT_AMBULATORY_CARE_PROVIDER_SITE_OTHER): Payer: Self-pay | Admitting: PHYSICIAN ASSISTANT

## 2023-07-21 VITALS — BP 132/66 | HR 99 | Temp 98.6°F | Resp 18 | Ht 68.0 in | Wt 227.2 lb

## 2023-07-21 DIAGNOSIS — R0609 Other forms of dyspnea: Secondary | ICD-10-CM

## 2023-07-21 DIAGNOSIS — I2510 Atherosclerotic heart disease of native coronary artery without angina pectoris: Secondary | ICD-10-CM | POA: Insufficient documentation

## 2023-07-21 DIAGNOSIS — J309 Allergic rhinitis, unspecified: Secondary | ICD-10-CM | POA: Insufficient documentation

## 2023-07-21 DIAGNOSIS — G4733 Obstructive sleep apnea (adult) (pediatric): Secondary | ICD-10-CM | POA: Insufficient documentation

## 2023-07-21 NOTE — Progress Notes (Signed)
[facility]  [address]  [place] [address]  Operated by [facility]     Follow up/Progress Note    Patient Name: Joel Weber  Date: 07/21/2023  Department:  [facility] Joel Weber  MRN: [id number]  DOB: Feb 01, 1951  Primary Care Provider:  Audrea Muscat, MD  Referring Provider:  No ref. provider found      Chief Complaint:   Chief Complaint   Patient presents with    Difficulty Breathing    Sleep Apnea     Nursing Notes:   Joel Clan, MA  07/21/23 3016  Signed  Epworth Scale Score:   0      Are you currently on CPAP/BIPAP/TRILOGY NIV  DME Company [employer]: [employer]  Has previous OSA symptoms resolved with CPAP/BIPAP/TRILOGY?Yes  Download available? No, Reason: forgot- cannot find online  Currently experiencing any additional sleep related issues?No  Oxygen therapy?  No  DME Company Information: na  What Liter Flow is the patient on? na  Have you received a flu shot? No  Have you received a pneumonia shot? No  Smoking History:    Social History     Tobacco Use   Smoking Status Never   Smokeless Tobacco Current     Have you had any recent hospitalizations?  No  Has the patient had any tests performed since the last visit? None  If so, where were the test(s) performed? na  Does the patient have any other associated symptoms or modifying factors? Shortness of breath, Wheezing, and Coughing    Joel Good, MA        HPI:  Patient with a history of OSA, mitral valve prolapse, diabetes, hypothyroidism, and celiac disease presents today for follow up. He was initially seen in the office as new hospital follow up on 02/03/2023.   He was admitted to [facility] on 12/05/2022 and presented to the ER for shortness of breath that had been ongoing for 3-4 weeks. Prior to this he reported he had been treated at outlying facility for COPD exacerbation. CT chest was negative for acute findings, but did  show CAD. Echo showed some diastolic dysfunction. Shortness of breath resolved and follow up PFTs were advised.  At last office visit in July he reported shortness of breath had resolved and PFTs that visit were essentially normal.  He does follow with Cardiology and explains he had echo recently.    Reports that last Tuesday he developed sudden onset of anterior chest discomfort and profuse sweating with associated shortness of breath.  It occurred when he was standing.  He did not seek medical attention and explains his chest pain resolved rather quickly but he had shortness of breath for about 2 hours which slowly improved.  Ever since then, he was had worsened shortness of breath and fatigue mainly with exertion.     Patient was previously diagnosed with OSA at [facility] but had difficulty tolerating CPAP device and quit.  His initial sleep study on 07/27/2018 showed an AHI of 47.8.  He was agreeable to titration study and resume CPAP. He was retitrated on 02/24/2023 to 8 cm H2O.  Reports he is doing well on CPAP.  Feels he is sleeping well and wakes up rested.  Still no download to review and he did not bring SD card today.    He is a lifelong nonsmoker, but did have secondhand smoke exposure. 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     Past Medical History:  Past Medical History:   Diagnosis Date    Cough     Diabetes mellitus (CMS HCC)     Hypothyroidism     Shortness of breath     Sleep apnea      Past Surgical History  Past Surgical History:   Procedure Laterality Date    CARDIAC CATHETERIZATION      HX APPENDECTOMY      HX BACK SURGERY      X7    HX CHOLECYSTECTOMY      HX COLONOSCOPY      HX HEART VALVE SURGERY       Medication List  Current Outpatient Medications   Medication Sig    ACCU-CHEK FASTCLIX LANCET DRUM Does not apply Misc TEST BLOOD SUGAR ONCE DAILY    ACCU-CHEK GUIDE TEST STRIPS Does not apply Strip TEST BLOOD  SUGAR ONCE DAILY    acetaminophen-codeine (TYLENOL #3) 300-30 mg Oral Tablet TAKE ONE TABLET TWICE DAILY AS NEEDED FOR PAIN    albuterol sulfate (PROVENTIL OR VENTOLIN OR PROAIR) 90 mcg/actuation Inhalation oral inhaler 2 puffs Inhalation every 4-6 hrs  As needed    albuterol sulfate (PROVENTIL) 2.5 mg /3 mL (0.083 %) Inhalation nebulizer solution 3 mL as needed Inhalation every 6 hrs    atenoloL (TENORMIN) 25 mg Oral Tablet 1 Tablet (25 mg total) (Patient not taking: Reported on 07/21/2023)    azelastine (ASTELIN) 137 mcg (0.1 %) Nasal Aerosol, Spray 1 Spray    dapagliflozin propanediol (FARXIGA) 10 mg Oral Tablet Oral (Patient not taking: Reported on 07/21/2023)    ergocalciferol, vitamin D2, (DRISDOL) 1,250 mcg (50,000 unit) Oral Capsule Take by mouth (Patient not taking: Reported on 07/21/2023)    fluticasone propionate (FLONASE) 50 mcg/actuation Nasal Spray, Suspension USE 1 SPRAY INTO EACH NOSTRIL ONCE DAILY (Patient not taking: Reported on 07/21/2023)    ipratropium/albuterol sulfate (IPRATROPIUM-ALBUTEROL INHL) 3 mL, Nebulized Inhalation, Soln-Inhalation, First Dose: 12/07/22 13:00:00 EST, Respiratory Neb Protocol? Yes    levothyroxine (SYNTHROID) 88 mcg Oral Tablet 1 tablet in the morning on an empty stomach Oral Once a day for 30 days    loperamide (IMODIUM) 2 mg Oral Capsule TAKE ONE TABLET BY MOUTH FOUR TIMES DAILY AS NEEDED (Patient not taking: Reported on 07/21/2023)    loratadine (CLARITIN) 10 mg Oral Tablet Take 1 Tablet (10 mg total) by mouth Once a day (Patient not taking: Reported on 07/21/2023)    LYRICA 150 mg Oral Capsule Take 1 Capsule (150 mg total) by mouth Three times a day    metoclopramide HCl (REGLAN) 5 mg Oral Tablet Take 1 Tablet (5 mg total) by mouth (Patient not taking: Reported on 07/21/2023)    pantoprazole (PROTONIX) 40 mg Oral Tablet, Delayed Release (E.C.) Take 1 Tablet (40 mg total) by mouth Once a day    potassium chloride (K-DUR) 20 mEq Oral Tab Sust.Rel. Particle/Crystal Take 1  Tablet (20 mEq total) by mouth Once a day (Patient not taking: Reported on 07/21/2023)    rosuvastatin (CRESTOR) 5 mg Oral Tablet Take 1 Tablet (5 mg total) by mouth Every evening (Patient not taking: Reported on 07/21/2023)    tadalafil (CIALIS) 5 mg Oral Tablet TAKE ONE TABLET daily AT THE same time every DAY (Patient not taking: Reported on 07/21/2023)    tamsulosin (FLOMAX) 0.4 mg Oral Capsule TAKE ONE CAPSULE BY MOUTH TWICE DAILY FOR KIDNEY STONE (Patient not taking:  Reported on 07/21/2023)    trimethoprim-sulfamethoxazole (BACTRIM DS) 160-800mg  per tablet TAKE ONE TABLET BY MOUTH TWICE DAILY drink plenty of fluids (Patient not taking: Reported on 07/21/2023)     Allergy List  Allergy History as of 07/21/23       WHEAT         Noted Status Severity Type Reaction    02/03/23 0918 Copen, [place], MA 11/08/21 Active High      Comments: Other Reaction(s): Abdominal Pain               AMPICILLIN         Noted Status Severity Type Reaction    02/03/23 0918 Copen, [place], MA 02/03/23 Active Medium                CARBAMAZEPINE         Noted Status Severity Type Reaction    02/03/23 0918 Copen, [place], MA 12/17/22 Active Medium  Hives/ Urticaria              DULOXETINE         Noted Status Severity Type Reaction    02/03/23 0918 Copen, [place], MA 12/17/22 Active High  Anaphylaxis, Hives/ Urticaria              PENICILLIN         Noted Status Severity Type Reaction    02/03/23 0918 Copen, [place], MA 02/03/23 Active Medium                CARBAPENEMS         Noted Status Severity Type Reaction    02/03/23 0918 Copen, [place], MA 11/08/21 Active                 CELECOXIB         Noted Status Severity Type Reaction    02/03/23 0918 Copen, [place], MA 11/08/21 Active                 MILK         Noted Status Severity Type Reaction    02/03/23 0918 Copen, [place], MA 11/08/21 Active                 BARIUM SULFATE         Noted Status Severity Type Reaction    02/03/23 0918 Copen, [place], MA 02/03/23 Active Low  Hives/  Urticaria                  Family History   Family Medical History:       Problem Relation (Age of Onset)    Asthma Father    COPD Father    Cancer Mother, Sister    Congestive Heart Failure Father    Diabetes Mother    Heart Attack Father, Brother    Heart Disease Brother    Hypertension (High Blood Pressure) Brother            Social History  Social History     Socioeconomic History    Marital status: Married   Tobacco Use    Smoking status: Never    Smokeless tobacco: Current     Types: Snuff   Vaping Use    Vaping status: Never Used   Substance and Sexual Activity    Alcohol use: Never    Drug use: Never        Review of system  General:  Denies fever, chills, night sweats, fatigue, loss of appetite.  Neurological:  Denies headache, snoring.  Gastrointestinal:  Denies reflux, heartburn, diarrhea.  Cardiovascular:  Denies chest pain, irregular heartbeats.  Musculoskeletal:  Denies joint pain, restless legs.  Endocrine/Metabolic:  Denies weight gain, weight loss.  Immunologic:  Denies sinus allergy symptoms.  Mental Status/Psychiatric:  Denies anxiety, depression.  ENT:  Denies nasal congestion, hoarseness, postnasal drip.  Integumentary:  Denies rash, new skin lesions.    Objective:  Vital Signs  Vitals:    07/21/23 0913   BP: 132/66   Pulse: 99   Resp: 18   Temp: 37 C (98.6 F)   TempSrc: Oral   SpO2: 96%   Weight: 103 kg (227 lb 3.2 oz)   Height: 1.727 m (5\' 8" )   BMI: 34.62         PHYSICAL EXAMINATION:   Constitutional:  Vital signs stable.  General appearance of the patient:  Alert, no acute distress.  Normal appearance, well nourished.  Eyes: PERRLA and normal eye lids.  Conjunctiva normal.  Ears, Nose, Mouth, and Throat: External inspection of ears and nose with normal appearance.  Inspection of lips, teeth and gums with normal appearance and oral mucosa normal.  Neck: Supple with trachea midline, non tender, no nodules, no masses, gland position midline.  Respiratory:  Auscultation of lungs with normal  breath sounds, no rales, no rhonchi, no wheezing.  Respiratory effort with no tractions, breathing regular and unlabored.  Cardiovascular:  Regular rhythm and regular rate.  No murmur, no peripheral edema.  Gastrointestinal: Abdomen non-tender, no masses, no hepatomegaly present.  Lymphatic:  No lymphadenopathy present, no supraclavicular nodes.  Musculoskeletal:  Normal gait and station, normal digits, no digital cyanosis or clubbing.  Mental Status/Psychiatric:  Alert, grossly oriented to person, place, and time.  Appropriate and normal mood.    Imaging  XR CHEST PA AND LATERAL  Narrative: Kavin Buffone    XR CHEST PA AND LATERAL performed on 07/21/2023 10:24 AM.    INDICATION:  R06.09: Dyspnea on exertion   Additional History:  Dyspnea on exertion    TECHNIQUE:  Frontal and lateral views of the chest.  2 views/2 images submitted for interpretation.    COMPARISON:  12/04/2022  ___________________________________  FINDINGS:    The heart size and central vascularity are within normal limits.  The lungs are well-expanded.  Scarring versus atelectasis in the left lung base. Stable.  Bilateral apical pleural thickening noted bilaterally, greatest on the right. No obvious underlying destructive bony lesion is identified. Stable.  Exaggerated lordosis of the lower thoracic spine.  ___________________________________  Impression: 1. Bilateral apical pleural thickening, right greater than left.  2. Scarring versus atelectasis the left lung base.  3. Exaggerated lordosis of the lower thoracic spine.    Radiologist location ID: [id number]      Assessment    ICD-10-CM    1. OSA (obstructive sleep apnea)  G47.33       2. Dyspnea on exertion  R06.09 XR CHEST PA AND LATERAL     Referral to Provider - External      3. Coronary artery disease  I25.10 Referral to Provider - External      4. Allergic rhinitis  J30.9             Plan  Continue medications as prescribed/directed unless changed by provider.  Plan of care discussed  with patient.    Continue CPAP for OSA.  We will request download from DME.  Discussed high concern that his symptoms last week could have been an acute coronary event.  He  has had no further chest pain since then but he still has fatigue and shortness of breath.  We will check chest x-ray today and also request in ASAP appointment with his cardiologist.  We advised ER if symptoms would worsened or any chest pain would return.    Addendum:  Chest x-ray from today reviewed.  Dr. Mordecai Maes also reviewed images and felt that biapical pleural thickening was mild and no need for CT at this point.  Continue with cardio follow-up.    Return for CXR today, Keep next scheduled appointment.      The patient was given the opportunity to ask questions and those questions were answered to the patient's satisfaction. The patient was encouraged to call with any additional questions or concerns. Discussed with the patient effects and side effects of medications. Medication safety was discussed.  The patient was informed to contact the office within 7 business days if a message/lab results/referral/imaging results have not been conveyed to the patient.    Electronically signed by Oval Linsey, PA-C      This note may have been partially generated using MModal Fluency Direct system, and there may be some incorrect words, spellings, and punctuation that were not noted in checking the note before saving.

## 2023-07-21 NOTE — Telephone Encounter (Signed)
Rec'd call from [facility] - they wanted to notify us to sched an appt for the pt.  Last week he had sudden onset of chest pain and shortness of breath which he told them about at his pulmonary visit and they told him they would call to let Dr. Darron Doom know and to request an appt for the patient to be seen as soon as possible.      Please notify the pt home# [phone]  wk# [phone] and mobile [phone]

## 2023-07-21 NOTE — Nursing Note (Signed)
Epworth Scale Score:   0      Are you currently on CPAP/BIPAP/TRILOGY NIV  DME Company [employer]: [employer]  Has previous OSA symptoms resolved with CPAP/BIPAP/TRILOGY?Yes  Download available? No, Reason: forgot- cannot find online  Currently experiencing any additional sleep related issues?No  Oxygen therapy?  No  DME Company Information: na  What Liter Flow is the patient on? na  Have you received a flu shot? No  Have you received a pneumonia shot? No  Smoking History:    Social History     Tobacco Use   Smoking Status Never   Smokeless Tobacco Current     Have you had any recent hospitalizations?  No  Has the patient had any tests performed since the last visit? None  If so, where were the test(s) performed? na  Does the patient have any other associated symptoms or modifying factors? Shortness of breath, Wheezing, and Coughing    Grahm Etsitty, MA

## 2023-07-22 ENCOUNTER — Encounter (INDEPENDENT_AMBULATORY_CARE_PROVIDER_SITE_OTHER): Payer: Self-pay | Admitting: CARDIOVASCULAR DISEASE

## 2023-07-22 ENCOUNTER — Ambulatory Visit: Payer: Medicare Other | Attending: CARDIOVASCULAR DISEASE | Admitting: CARDIOVASCULAR DISEASE

## 2023-07-22 VITALS — BP 131/67 | HR 80 | Temp 97.3°F | Resp 16 | Ht 68.0 in | Wt 227.0 lb

## 2023-07-22 DIAGNOSIS — G4733 Obstructive sleep apnea (adult) (pediatric): Secondary | ICD-10-CM | POA: Insufficient documentation

## 2023-07-22 DIAGNOSIS — E785 Hyperlipidemia, unspecified: Secondary | ICD-10-CM | POA: Insufficient documentation

## 2023-07-22 DIAGNOSIS — R61 Generalized hyperhidrosis: Secondary | ICD-10-CM

## 2023-07-22 DIAGNOSIS — I209 Angina pectoris, unspecified: Secondary | ICD-10-CM | POA: Insufficient documentation

## 2023-07-22 NOTE — Progress Notes (Addendum)
CARDIOLOGY [facility]  [address]  [place] [address]  Operated by [facility]     Name: Joel Weber MRN:  [id number]   Date: 07/22/2023 Age: 72 y.o.       Follow up Progress Note  PCP: Audrea Muscat, MD    HPI:  Joel Weber is a 72 y.o. year old male who presents to the clinic for  recurrent episodes of chest pain.  He also has shortness of breath on minimal exertion.  These symptoms are progressive.    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  The patient has not had any chest pain since then.  The patient also complains of intermittent abdominal pain from time to time for the last several weeks.  He has a marked shortness of breath with minimal activity.  He was seen with the pulmonologist who recommended cardiac evaluation.  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  The patient has a longstanding history of diabetes mellitus and sleep apnea.  The pulmonologist recommended early cardiac evaluation.    Allergies   Allergen Reactions    Duloxetine Anaphylaxis and Hives/ Urticaria    Wheat      Other Reaction(s): Abdominal Pain    Ampicillin     Carbamazepine Hives/ Urticaria    Penicillin     Carbapenems     Celecoxib     Milk     Barium Sulfate Hives/ Urticaria     Social History     Tobacco Use   Smoking Status Never   Smokeless Tobacco Current       Problem List          Cardiovascular System    Angina pectoris (CMS HCC)    Hyperlipidemia     Coronary artery disease       Respiratory    OSA (obstructive sleep apnea)    Dyspnea on exertion       Ear, Nose, and Throat    Allergic rhinitis       Other    Abnormal CT scan       Current Medical History:  I have comprehensively reviewed the records of their past medical history, family history, social history, along with medication history and they are updated per HPI.    Review of Systems:  General: No fever, No chills  Neck: No pain or swelling  HEENT: No dizziness  Cardiac: As noted in HPI  Pulmonary: As noted in HPI  Abdomen: No pain or change in bowel  GU:  No hematuria    Physical Exam:   BP 131/67 (Site: Right Arm, Patient Position: Sitting, Cuff Size: Adult Large)   Pulse 80   Temp 36.3 C (97.3 F)   Resp 16   Ht 1.727 m (5\' 8" )   Wt 103 kg (227 lb)   SpO2 97%   BMI 34.52 kg/m       General: Normal.  Skin: Normal.  Eyes: Normal.  HENT: Normal.  Respiratory: Normal.  Cardiovascular: Normal.  GI: Normal.  Extremities:  No edema of the lower extremities.   Musculoskeletal:  Normal.  Neurologic:  Normal.  Psychiatric: Normal.    Wt Readings from Last 3 Encounters:   07/22/23 103 kg (227 lb)   07/21/23 103 kg (227 lb 3.2 oz)   05/09/23 98.9 kg (218 lb)       Current Medications:   Current Outpatient Medications   Medication Sig Dispense Refill    ACCU-CHEK FASTCLIX LANCET DRUM Does not apply Misc TEST BLOOD SUGAR ONCE DAILY      ACCU-CHEK GUIDE TEST STRIPS Does not apply Strip TEST BLOOD SUGAR ONCE DAILY      acetaminophen-codeine (TYLENOL #3) 300-30 mg Oral Tablet TAKE ONE TABLET TWICE DAILY AS NEEDED FOR PAIN      albuterol sulfate (PROVENTIL OR VENTOLIN OR PROAIR) 90 mcg/actuation Inhalation oral inhaler 2 puffs Inhalation every 4-6 hrs  As needed      albuterol sulfate (PROVENTIL) 2.5 mg /3 mL (0.083 %) Inhalation nebulizer solution 3 mL as needed Inhalation every 6 hrs      azelastine (ASTELIN) 137 mcg (0.1 %) Nasal Aerosol, Spray 1 Spray      ipratropium/albuterol sulfate  (IPRATROPIUM-ALBUTEROL INHL) 3 mL, Nebulized Inhalation, Soln-Inhalation, First Dose: 12/07/22 13:00:00 EST, Respiratory Neb Protocol? Yes      levothyroxine (SYNTHROID) 88 mcg Oral Tablet 1 tablet in the morning on an empty stomach Oral Once a day for 30 days      loratadine (CLARITIN) 10 mg Oral Tablet Take 1 Tablet (10 mg total) by mouth Once a day      LYRICA 150 mg Oral Capsule Take 1 Capsule (150 mg total) by mouth Three times a day      pantoprazole (PROTONIX) 40 mg Oral Tablet, Delayed Release (E.C.) Take 1 Tablet (40 mg total) by mouth Once a day       No current facility-administered medications for this visit.       Current Labs:   Followed by primary physician.    Orders:  No orders of the defined types were placed in this encounter.    Medications Discontinued During This Encounter   Medication Reason    atenoloL (TENORMIN) 25 mg Oral Tablet Patient states no longer taking    dapagliflozin propanediol (FARXIGA) 10 mg Oral Tablet Patient states no longer taking    ergocalciferol, vitamin D2, (DRISDOL) 1,250 mcg (50,000 unit) Oral Capsule Patient states no longer taking    fluticasone propionate (FLONASE) 50 mcg/actuation Nasal Spray, Suspension Patient states no longer taking    loperamide (IMODIUM) 2 mg Oral Capsule Patient states no longer taking    potassium chloride (K-DUR) 20 mEq Oral Tab Sust.Rel. Particle/Crystal Patient states no longer taking    rosuvastatin (CRESTOR) 5 mg Oral Tablet Patient states no longer taking    tadalafil (CIALIS) 5 mg Oral Tablet Patient states no longer taking    tamsulosin (FLOMAX) 0.4 mg Oral Capsule Patient states no longer taking    trimethoprim-sulfamethoxazole (BACTRIM DS) 160-800mg  per tablet Patient states no longer taking    metoclopramide HCl (REGLAN) 5 mg Oral Tablet Patient states no longer taking       ASSESSMENT/PLAN:   Assessment/Plan   1. Angina pectoris (CMS HCC)    2. OSA (obstructive sleep apnea)    3. Hyperlipidemia, unspecified hyperlipidemia type         1.  The patient is having recurrent anginal episodes associated with profuse sweating.  This episode occurred about a week ago.  In view of his  symptoms he will recommend a cardiac catheterization to evaluate for coronary artery disease.  The patient was explained about cardiac catheterization.  Risk benefit analysis was done.  We will try to do it was soon as we get authorization from the insurance company.    2.  Hyperlipidemia followed by primary physician.  I am not sure why he is not taking Crestor and is unable to describe it.  I will get him to start medication again.  Spoke to the patient at length about tobacco cessation.  He is willing to try again.    Follow Up:    Return in about 6 months (around 01/19/2024).     Zenaida Deed, MD,07/22/2023,13:26    [facility]  Cardiology   [facility]    A portion of this documentation may have been generated using [facility] voice recognition software and may contain syntax/voice recognition errors.

## 2023-07-31 ENCOUNTER — Telehealth (INDEPENDENT_AMBULATORY_CARE_PROVIDER_SITE_OTHER): Payer: Self-pay | Admitting: CARDIOVASCULAR DISEASE

## 2023-07-31 NOTE — Telephone Encounter (Signed)
Left message for patient to call back to go over cath date/time and other procedure information.

## 2023-07-31 NOTE — Telephone Encounter (Signed)
I called patient to go over cath date/time and procedure information.  He state that he didn't think he wanted to do it right now.  He will call us back if he changes his mind.

## 2023-09-23 ENCOUNTER — Encounter (INDEPENDENT_AMBULATORY_CARE_PROVIDER_SITE_OTHER): Payer: Self-pay | Admitting: CARDIOVASCULAR DISEASE

## 2023-09-23 ENCOUNTER — Other Ambulatory Visit (INDEPENDENT_AMBULATORY_CARE_PROVIDER_SITE_OTHER): Payer: Self-pay | Admitting: CARDIOVASCULAR DISEASE

## 2023-09-23 DIAGNOSIS — E119 Type 2 diabetes mellitus without complications: Secondary | ICD-10-CM

## 2023-09-23 DIAGNOSIS — I2510 Atherosclerotic heart disease of native coronary artery without angina pectoris: Secondary | ICD-10-CM

## 2023-09-23 DIAGNOSIS — R079 Chest pain, unspecified: Secondary | ICD-10-CM | POA: Insufficient documentation

## 2023-09-23 DIAGNOSIS — I10 Essential (primary) hypertension: Secondary | ICD-10-CM

## 2023-10-02 ENCOUNTER — Other Ambulatory Visit (HOSPITAL_COMMUNITY): Payer: Medicare Other

## 2023-10-02 ENCOUNTER — Ambulatory Visit: Payer: Medicare Other | Attending: CARDIOVASCULAR DISEASE

## 2023-10-02 ENCOUNTER — Encounter (INDEPENDENT_AMBULATORY_CARE_PROVIDER_SITE_OTHER): Payer: Self-pay

## 2023-10-02 ENCOUNTER — Other Ambulatory Visit: Payer: Self-pay

## 2023-10-02 VITALS — BP 134/64 | HR 83 | Temp 96.9°F | Resp 16 | Ht 68.0 in | Wt 227.0 lb

## 2023-10-02 DIAGNOSIS — E119 Type 2 diabetes mellitus without complications: Secondary | ICD-10-CM | POA: Insufficient documentation

## 2023-10-02 DIAGNOSIS — R079 Chest pain, unspecified: Secondary | ICD-10-CM

## 2023-10-02 DIAGNOSIS — I209 Angina pectoris, unspecified: Secondary | ICD-10-CM | POA: Insufficient documentation

## 2023-10-02 DIAGNOSIS — I2510 Atherosclerotic heart disease of native coronary artery without angina pectoris: Secondary | ICD-10-CM | POA: Insufficient documentation

## 2023-10-02 DIAGNOSIS — I10 Essential (primary) hypertension: Secondary | ICD-10-CM

## 2023-10-02 DIAGNOSIS — E785 Hyperlipidemia, unspecified: Secondary | ICD-10-CM | POA: Insufficient documentation

## 2023-10-02 LAB — CBC WITH DIFF
BASOPHIL #: <0.1 10*3/uL (ref <=0.20)
BASOPHIL %: 0.5 %
EOSINOPHIL #: 0.19 10*3/uL (ref <=0.50)
EOSINOPHIL %: 2.3 %
HCT: 44.1 % (ref 38.9–52.0)
HGB: 14.8 g/dL (ref 13.4–17.5)
IMMATURE GRANULOCYTE #: <0.1 10*3/uL (ref <0.10)
IMMATURE GRANULOCYTE %: 0.5 % (ref 0.0–1.0)
LYMPHOCYTE #: 2.16 10*3/uL (ref 1.00–4.80)
LYMPHOCYTE %: 25.7 %
MCH: 31.6 pg (ref 26.0–32.0)
MCHC: 33.6 g/dL (ref 31.0–35.5)
MCV: 94.2 fL (ref 78.0–100.0)
MONOCYTE #: 0.67 10*3/uL (ref 0.20–1.10)
MONOCYTE %: 8 %
MPV: 10.1 fL (ref 8.7–12.5)
NEUTROPHIL #: 5.3 10*3/uL (ref 1.50–7.70)
NEUTROPHIL %: 63 %
PLATELETS: 208 10*3/uL (ref 150–400)
RBC: 4.68 10*6/uL (ref 4.50–6.10)
RDW-CV: 13.9 % (ref 11.5–15.5)
WBC: 8.4 10*3/uL (ref 3.7–11.0)

## 2023-10-02 LAB — BASIC METABOLIC PANEL
ANION GAP: 14 mmol/L
BUN/CREA RATIO: 19
BUN: 17 mg/dL (ref 8–23)
CALCIUM: 8.6 mg/dL (ref 8.3–10.7)
CHLORIDE: 100 mmol/L (ref 96–106)
CO2 TOTAL: 24 mmol/L (ref 22–30)
CREATININE: 0.88 mg/dL (ref 0.70–1.20)
ESTIMATED GFR: >90 mL/min/{1.73_m2} (ref >90)
GLUCOSE: 132 mg/dL — ABNORMAL HIGH (ref 74–109)
POTASSIUM: 3.9 mmol/L (ref 3.2–5.0)
SODIUM: 138 mmol/L (ref 133–144)

## 2023-10-02 LAB — HGA1C (HEMOGLOBIN A1C WITH EST AVG GLUCOSE)
ESTIMATED AVERAGE GLUCOSE: 206 mg/dL
HEMOGLOBIN A1C: 8.8 % — ABNORMAL HIGH (ref 4.0–6.0)

## 2023-10-02 LAB — PT/INR
INR: 1.02 (ref 0.86–1.14)
PROTHROMBIN TIME: 13.6 s (ref 11.4–14.2)

## 2023-10-02 LAB — PTT (PARTIAL THROMBOPLASTIN TIME): APTT: 33.7 s (ref 24.0–35.6)

## 2023-10-02 NOTE — H&P (Signed)
Cardiology History and Physical     Joel, Weber, 72 y.o. male  Date of Service: 10/02/2023   Date of Birth:  16-Dec-1950  PCP:  Audrea Muscat, MD            HPI:  Joel Weber is a 72 y.o. male patient of Dr. Darron Doom who presents for pre-admission testing. 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  The patient has not had any chest pain since then.  The patient also complains of intermittent abdominal pain from time to time for the last several weeks.  He has a marked shortness of breath with minimal activity.  He was seen with the pulmonologist who recommended cardiac evaluation.  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  The patient has a longstanding history of diabetes mellitus and sleep apnea.  The pulmonologist recommended early cardiac evaluation.       Past Medical History:   Diagnosis Date    Cough     Diabetes mellitus (CMS HCC)     Hypothyroidism     Shortness of breath     Sleep apnea          Past Surgical History:   Procedure Laterality Date    CARDIAC CATHETERIZATION      HX APPENDECTOMY      HX BACK SURGERY      X7    HX CHOLECYSTECTOMY      HX COLONOSCOPY      HX HEART VALVE SURGERY             Prior to Admission Medications:   Cannot display prior to admission medications because the patient has not been admitted in this contact.          Allergies   Allergen Reactions    Duloxetine Anaphylaxis and Hives/ Urticaria    Wheat      Other Reaction(s): Abdominal Pain    Ampicillin     Carbamazepine Hives/ Urticaria    Penicillin      Carbapenems     Celecoxib     Milk     Barium Sulfate Hives/ Urticaria     Dye Allergy:  No  Iodine Allergy:  No    Family History:  Family Medical History:       Problem Relation (Age of Onset)    Asthma Father    COPD Father    Cancer Mother, Sister    Congestive Heart Failure Father    Diabetes Mother    Heart Attack Father, Brother    Heart Disease Brother    Hypertension (High Blood Pressure) Brother            ROS:  All systems reviewed and no additional remarkable complaints except stated above in HPI    Exam:  General/Constitutional: Awake alert oriented, no acute distress  HEENT: PERLA, moist mucous membranes  Neck: supple, no JVD, no bruits  Heart/Cardiovascular:   RRR no murmurs/rubs/gallops  Lung clear   Abd sounds N x 4 no guarding rebound or tenderness  Ext no clubbing cyanosis or edema  Skin no rashes or hives, weeping , wounds or psoriatic  lesions noted  Neurological: alert and oriented x3, grossly nonfocal      Labs:    CBC Results Differential Results   No results for input(s): "WBC", "HGB", "HCT", "BANDS" in the last 72 hours.    Invalid input(s): "PLATELETCOUNT" No results for input(s): "PMNS", "LYMPHOCYTES", "MONOCYTES", "EOSINOPHIL", "BASOPHILS", "NRBCS", "PMNABS", "LYMPHSABS", "MONOSABS", "EOSABS", "BASOSABS" in the last 72 hours.   BMP Results Other Chemistries Results   No results for input(s): "SODIUM", "POTASSIUM", "CHLORIDE", "CO2", "BUN", "CREATININE", "GFR", "ANIONGAP" in the last 72 hours. No results for input(s): "CALCIUM", "ALBUMIN", "MAGNESIUM", "PHOSPHORUS" in the last 72 hours.   Liver/Pancreas Enzyme Results Blood Gas     No results for input(s): "TOTALPROTEIN", "ALBUMIN", "PREALBUMIN", "AST", "ALT", "ALKPHOS", "LDH", "AMYLASE", "LIPASE" in the last 72 hours.    Invalid input(s): "GGT" No results found for this encounter   Cardiac Results    Coags Results     No results for input(s): "UHCEASTTROPI", "CKMB", "MBINDEX", "BNP" in the last 72 hours. No results for input(s): "INR" in  the last 72 hours.    Invalid input(s): "PTT", "PT"       Imaging Studies:     Last Echo       LEFT HEART CATH  No results found for this or any previous visit.    STRESS TEST/MYOCARDIAL PERFUSION IMAGING  No results found for this or any previous visit.      Previous CXR:   Recent Results (from the past [id number] hour(s))   XR CHEST PA AND LATERAL    Collection Time: 07/21/23 10:24 AM    Narrative    Taishaun Petrenko    XR CHEST PA AND LATERAL performed on 07/21/2023 10:24 AM.    INDICATION:  R06.09: Dyspnea on exertion   Additional History:  Dyspnea on exertion    TECHNIQUE:  Frontal and lateral views of the chest.  2 views/2 images submitted for interpretation.    COMPARISON:  12/04/2022  ___________________________________  FINDINGS:    The heart size and central vascularity are within normal limits.  The lungs are well-expanded.  Scarring versus atelectasis in the left lung base. Stable.  Bilateral apical pleural thickening noted bilaterally, greatest on the right. No obvious underlying destructive bony lesion is identified. Stable.  Exaggerated lordosis of the lower thoracic spine.  ___________________________________    Impression    1. Bilateral apical pleural thickening, right greater than left.  2. Scarring versus atelectasis the left lung base.  3. Exaggerated lordosis of the lower thoracic spine.        Radiologist location ID: [id number]        Coronary CTA: 05/28/2023  FINDINGS:     This is a right dominant system.     RCA: Calcified plaque causes no significant stenosis.     LEFT MAIN CORONARY ARTERY: Normal vessel without significant plaque or stenosis.     LAD: Calcified plaques cause mild stenoses.     LCx: Normal vessel without significant plaque or stenosis.     NONCORONARY CARDIAC FINDINGS:     The heart is normal in size. There are no findings of intracardiac thrombus.     NONCARDIAC FINDINGS:     Foci of scarring and/or atelectasis are noted in the included portions of the lungs. The entire  chest was not included on the exam.      Assessment/Plan:  Assessment/Plan   1. Coronary artery disease    2. Angina pectoris    3. Hyperlipidemia, unspecified hyperlipidemia type    4.  Chest pain, unspecified type      At this time, we would recommend an invasive coronary evaluation. I discussed the risks and benefits with the patient, including but not limited to: damage to an artery/vein or nerve, bleeding, infection, allergic reaction to the dye, kidney damage, MI, stroke, restenosis, radiation exposure, risks of conscious sedation, emergent bypass, and death. All questions have been answered to the patient's apparent satisfaction and they wish to proceed in this manner with Dr Adron Bene, NP

## 2023-10-03 ENCOUNTER — Inpatient Hospital Stay
Admission: RE | Admit: 2023-10-03 | Discharge: 2023-10-03 | Disposition: A | Discharge status: Home / Self Care | Payer: Medicare Other | Source: Ambulatory Visit | Attending: CARDIOVASCULAR DISEASE | Admitting: CARDIOVASCULAR DISEASE

## 2023-10-03 ENCOUNTER — Encounter (HOSPITAL_COMMUNITY)
Admission: RE | Disposition: A | Discharge status: Home / Self Care | Payer: Self-pay | Source: Ambulatory Visit | Attending: CARDIOVASCULAR DISEASE

## 2023-10-03 ENCOUNTER — Encounter (HOSPITAL_COMMUNITY): Payer: Self-pay | Admitting: CARDIOVASCULAR DISEASE

## 2023-10-03 DIAGNOSIS — F17220 Nicotine dependence, chewing tobacco, uncomplicated: Secondary | ICD-10-CM

## 2023-10-03 DIAGNOSIS — I10 Essential (primary) hypertension: Secondary | ICD-10-CM | POA: Insufficient documentation

## 2023-10-03 DIAGNOSIS — Z7984 Long term (current) use of oral hypoglycemic drugs: Secondary | ICD-10-CM | POA: Insufficient documentation

## 2023-10-03 DIAGNOSIS — I25119 Atherosclerotic heart disease of native coronary artery with unspecified angina pectoris: Secondary | ICD-10-CM | POA: Insufficient documentation

## 2023-10-03 DIAGNOSIS — Z8249 Family history of ischemic heart disease and other diseases of the circulatory system: Secondary | ICD-10-CM | POA: Insufficient documentation

## 2023-10-03 DIAGNOSIS — Z720 Tobacco use: Secondary | ICD-10-CM | POA: Insufficient documentation

## 2023-10-03 DIAGNOSIS — R079 Chest pain, unspecified: Secondary | ICD-10-CM | POA: Insufficient documentation

## 2023-10-03 DIAGNOSIS — E119 Type 2 diabetes mellitus without complications: Secondary | ICD-10-CM | POA: Insufficient documentation

## 2023-10-03 SURGERY — CORONARY ANGIOGRAPHY W/LEFT HEART CATH W/WO LVG
Anesthesia: IV Sedation (Nurse Monitored)

## 2023-10-03 MED ORDER — ROSUVASTATIN 10 MG TABLET
10.0000 mg | ORAL_TABLET | Freq: Every evening | ORAL | 4 refills | Status: AC
Start: 2023-10-03 — End: ?

## 2023-10-03 MED ORDER — DIAZEPAM 5 MG TABLET
ORAL_TABLET | ORAL | Status: AC
Start: 2023-10-03 — End: 2023-10-03
  Administered 2023-10-03: 5 mg
  Filled 2023-10-03: qty 1

## 2023-10-03 MED ORDER — MIDAZOLAM 1 MG/ML INJECTION WRAPPER
INTRAMUSCULAR | Status: AC
Start: 2023-10-03 — End: 2023-10-03
  Filled 2023-10-03: qty 2

## 2023-10-03 MED ORDER — LIDOCAINE HCL 10 MG/ML (1 %) INJECTION SOLUTION
Freq: Once | INTRAMUSCULAR | Status: DC | PRN
Start: 2023-10-03 — End: 2023-10-03
  Administered 2023-10-03: 15 mL via SUBCUTANEOUS

## 2023-10-03 MED ORDER — LIDOCAINE HCL 10 MG/ML (1 %) INJECTION SOLUTION
INTRAMUSCULAR | Status: AC
Start: 2023-10-03 — End: 2023-10-03
  Filled 2023-10-03: qty 20

## 2023-10-03 MED ORDER — MIDAZOLAM 1 MG/ML INJECTION WRAPPER
Freq: Once | INTRAMUSCULAR | Status: DC | PRN
Start: 2023-10-03 — End: 2023-10-03
  Administered 2023-10-03: 2 mg via INTRAVENOUS

## 2023-10-03 MED ORDER — DIPHENHYDRAMINE 25 MG CAPSULE
ORAL_CAPSULE | ORAL | Status: AC
Start: 2023-10-03 — End: 2023-10-03
  Administered 2023-10-03: 25 mg
  Filled 2023-10-03: qty 1

## 2023-10-03 MED ORDER — IOPAMIDOL 370 MG IODINE/ML (76 %) INTRAVENOUS SOLUTION
Freq: Once | INTRAVENOUS | Status: DC | PRN
Start: 2023-10-03 — End: 2023-10-03
  Administered 2023-10-03: 40 mL via INTRA_ARTERIAL

## 2023-10-03 MED ORDER — ASPIRIN 81 MG TABLET,DELAYED RELEASE
81.0000 mg | DELAYED_RELEASE_TABLET | Freq: Every day | ORAL | Status: DC
Start: 2023-10-03 — End: 2024-04-23

## 2023-10-03 SURGICAL SUPPLY — 5 items
CATH ANGIO 6FR 145D PGTL CURVE 110CM INFNT LRG INNER LUM 6 SH RADOPQ VENTRIC VESTAN SS STRL LF ACPT (CATHETERS) ×1 IMPLANT
CATH ANGIO 6FR 3DRC CURVE 100CM INFNT LRG INNER LUM RADOPQ SLCT COR FEM VESTAN SS STRL LF  ACPT .038 (CATHETERS) ×1 IMPLANT
CATH ANGIO 6FR JL4 CURVE 100CM INFNT LRG INNER LUM RADOPQ SLCT COR FEM VESTAN SS STRL LF  ACPT .038 (CATHETERS) ×1 IMPLANT
GW .035IN 150CM FIX COR PTFE VAS 3MM RAD J CURVE LF (WIRE) ×1 IMPLANT
SHEATH 6FR 10CM 2.5CM PINN HDRPH PTFE DIL KINK RST INTROD COR (VASCULAR) ×1 IMPLANT

## 2023-10-03 NOTE — Discharge Instructions (Signed)
POST CATH INSTRUCTIONS   no driving Z61 hours.   Remove dressing 24 hours after the procedure leave site open air.  You may shower 24 hours postop but do not submerge site in water for 7 days.  no lifting more than 5lbs x3 days   If any bleeding or swelling showed occur, apply manual pressure directly over the access site and report to the nearest hospital for evaluation.  Seek immediate medical attention if there is any loss of sensation, redness, swelling, or discharge at the access site.  For moderate discomfort elevate the limb and apply ice as needed.  call your cardiologists office or return to emergency room with any new symptoms.

## 2023-10-03 NOTE — Nurses Notes (Signed)
Patient assisted up to restroom @ approximately 1315 after eating lunch. R groin remains D/I with no hematoma or bleeding. VSS and wife @ bedside. Patient in no distress and voices no complaints. Discharge instructions printed and discussed. L hand IV taken out with tip in tact. All questions answered and instructions discussed at length. Understanding and appreciation verbalized by patient and wife with all belongings on self.

## 2023-10-03 NOTE — H&P (Signed)
[facility]  Cardiology   H&P    Eleno, Vaden, 72 y.o. male  Encounter Start Date:  10/03/2023  Inpatient Admission Date:    Date of Service: 10/03/2023  Admission Source: Home  Date of Birth:  06-24-1951  PCP:  Audrea Muscat, MD    Information Obtained from: patient and history reviewed via medical record  Chief Complaint:    Chief Complaint   Patient presents with    Cardiac Cath          HPI:  Joel Weber is a 72 y.o. male with past medical history significant for recurrent episodes of chest pain.  who presents today for cardiac catheterization.  The patient has been having recurrent episodes of left-sided chest discomfort.  Has a longstanding history of hypertension, diabetes mellitus and history of chewing tobacco.  His CT coronary angiogram showed significant calcification of the left anterior descending coronary artery.  He is not allergic to contrast.    Medical History:  Past Medical History:   Diagnosis Date    Cough     Diabetes mellitus (CMS HCC)     Hypothyroidism     Shortness of breath     Sleep apnea          Surgical History:  Past Surgical History:   Procedure Laterality Date    CARDIAC CATHETERIZATION      HX APPENDECTOMY      HX BACK SURGERY      X7    HX CHOLECYSTECTOMY      HX COLONOSCOPY      HX HEART VALVE SURGERY           Prior to Admission Medications:  Medications Prior to Admission       Prescriptions    ACCU-CHEK FASTCLIX LANCET DRUM Does not apply Misc    TEST BLOOD SUGAR ONCE DAILY    ACCU-CHEK GUIDE TEST STRIPS Does not apply Strip    TEST BLOOD SUGAR ONCE DAILY    acetaminophen-codeine (TYLENOL #3) 300-30 mg Oral Tablet    albuterol sulfate (PROVENTIL OR VENTOLIN OR PROAIR) 90 mcg/actuation Inhalation oral inhaler    2 puffs Inhalation every 4-6 hrs  As needed    albuterol sulfate (PROVENTIL) 2.5 mg /3 mL (0.083 %) Inhalation nebulizer solution    3 mL as needed Inhalation every 6 hrs    azelastine (ASTELIN) 137 mcg (0.1 %) Nasal Aerosol, Spray     1 Spray    ipratropium/albuterol sulfate (IPRATROPIUM-ALBUTEROL INHL)    3 mL, Nebulized Inhalation, Soln-Inhalation, First Dose: 12/07/22 13:00:00 EST, Respiratory Neb Protocol? Yes    JARDIANCE 25 mg Oral Tablet    Take 1 Tablet (25 mg total) by mouth Once a day    levothyroxine (SYNTHROID) 88 mcg Oral Tablet    1 tablet in the morning on an empty stomach Oral Once a day for 30 days    loratadine (CLARITIN) 10 mg Oral Tablet    Take 1 Tablet (10 mg total) by mouth Once a day    LYRICA 150 mg Oral Capsule    Take 1 Capsule (150 mg total) by mouth Three times a day    pantoprazole (PROTONIX) 40 mg Oral Tablet, Delayed Release (E.C.)    Take 1 Tablet (40 mg total) by mouth Once a day          Allergies   Allergen Reactions    Duloxetine Anaphylaxis and Hives/ Urticaria    Wheat      Other Reaction(s): Abdominal  Pain    Ampicillin     Carbamazepine Hives/ Urticaria    Penicillin     Carbapenems     Celecoxib      Dye Allergy:  No  Iodine Allergy:  No  Family History:  Family Medical History:       Problem Relation (Age of Onset)    Asthma Father    COPD Father    Cancer Mother, Sister    Congestive Heart Failure Father    Diabetes Mother    Heart Attack Father, Brother    Heart Disease Brother    Hypertension (High Blood Pressure) Brother            ROS: Other than ROS in the HPI, all other systems were negative.     Exam:  Temperature: 36.4 C (97.6 F)  Heart Rate: 78  BP (Non-Invasive): (!) 142/84  Respiratory Rate: 16  SpO2: 97 %  General: No acute distress and appears stated age.    HEENT:Head normocephalic, atraumatic. ENT without erythema or injection, mucouse membranes moist.    Neck: No JVD, no carotid bruit. and supple, symmetrical, trachea midline.   Lungs: Clear to auscultation bilaterally.    Cardiovascular: Regular rate and rhythm without murmur and vascular pulses are 2+ throughout.  There is no murmur or gallop.    Abdomen: Soft, non-tender and bowel sounds normal.    Extremities: Extremities normal,  atraumatic, no cyanosis or edema.    Skin: Skin warm and dry.    Neurologic: Alert and oriented x3.  Psych: Mood and affect congruent for age and gender     Labs:  Lab Results Today:  BMP:     Recent Labs     10/02/23  0829   SODIUM 138   POTASSIUM 3.9   CHLORIDE 100   CO2 24   BUN 17   CREATININE 0.88   GLUCOSENF 132*   ANIONGAP 14   BUNCRRATIO 19   GFR >90   CALCIUM 8.6     CBC Results Differential Results   Recent Labs     10/02/23  0829   WBC 8.4   HGB 14.8   HCT 44.1   PLTCNT 208    Recent Results (from the past 30 hour(s))   CBC WITH DIFF    Collection Time: 10/02/23  8:29 AM   Result Value    WBC 8.4    NEUTROPHIL % 63.0    MONOCYTE % 8.0    BASOPHIL % 0.5    BASOPHIL # <0.10          Coags:    Recent Labs     10/02/23  0829   PROTHROMTME 13.6   INR 1.02   APTT 33.7       Imaging Studies:          Assessment/Plan:  Active Hospital Problems    Diagnosis    Primary Problem: Chest pain, unspecified     Angina pectoris    We will proceed with cardiac catheterization this morning.  Risk and benefit analysis done for cardiac catheterization.  Risk of bleeding vascular complications stroke etc. were explained.      2.  Hypertension under control      3.  Tobacco chewing spoke to patient at length about stopping chewing tobacco.      Zenaida Deed, MD 10/03/2023

## 2023-10-06 ENCOUNTER — Encounter (INDEPENDENT_AMBULATORY_CARE_PROVIDER_SITE_OTHER): Payer: Self-pay | Admitting: CARDIOVASCULAR DISEASE

## 2023-10-20 DIAGNOSIS — I440 Atrioventricular block, first degree: Secondary | ICD-10-CM

## 2023-10-20 DIAGNOSIS — R9431 Abnormal electrocardiogram [ECG] [EKG]: Secondary | ICD-10-CM

## 2023-10-20 LAB — ECG 12 LEAD W/ INTERP (AMB USE ONLY) (MUSE, IN CLINC) (93005/93010)
Atrial Rate: 83 {beats}/min
Calculated P Axis: 39 degrees
Calculated R Axis: 78 degrees
Calculated T Axis: -13 degrees
PR Interval: 236 ms
QRS Duration: 96 ms
QT Interval: 374 ms
QTC Calculation: 439 ms
Ventricular rate: 83 {beats}/min

## 2023-10-24 ENCOUNTER — Encounter (INDEPENDENT_AMBULATORY_CARE_PROVIDER_SITE_OTHER): Payer: Self-pay | Admitting: CARDIOVASCULAR DISEASE

## 2023-10-24 ENCOUNTER — Other Ambulatory Visit: Payer: Self-pay

## 2023-10-24 ENCOUNTER — Ambulatory Visit: Payer: Medicare Other | Attending: CARDIOVASCULAR DISEASE | Admitting: CARDIOVASCULAR DISEASE

## 2023-10-24 VITALS — BP 144/65 | HR 100 | Temp 98.1°F | Resp 16 | Ht 68.0 in | Wt 227.0 lb

## 2023-10-24 DIAGNOSIS — F17220 Nicotine dependence, chewing tobacco, uncomplicated: Secondary | ICD-10-CM

## 2023-10-24 DIAGNOSIS — R079 Chest pain, unspecified: Secondary | ICD-10-CM | POA: Insufficient documentation

## 2023-10-24 DIAGNOSIS — E119 Type 2 diabetes mellitus without complications: Secondary | ICD-10-CM | POA: Insufficient documentation

## 2023-10-24 DIAGNOSIS — N529 Male erectile dysfunction, unspecified: Secondary | ICD-10-CM | POA: Insufficient documentation

## 2023-10-24 DIAGNOSIS — I2510 Atherosclerotic heart disease of native coronary artery without angina pectoris: Secondary | ICD-10-CM | POA: Insufficient documentation

## 2023-10-24 MED ORDER — TADALAFIL 5 MG TABLET
5.0000 mg | ORAL_TABLET | ORAL | 0 refills | Status: DC | PRN
Start: 2023-10-24 — End: 2024-04-23

## 2023-10-24 NOTE — Progress Notes (Addendum)
Cardiology [facility], Medical Office Building [place]  [address]  [place] [address]  [phone]    Cardiology  Return Patient Progress Note    Name: Joel Weber   DOB: 1951/06/27  [72 y.o. male]   MRN: [id number]       Visit Date: 10/24/2023   Referring: Hezzie Bump, FNP-C  [address]  [place],  [place] [address]   PCP: Audrea Muscat, MD         Reason for Visit:  Follow up visit with cardiology  Chief Complaint:   Chief Complaint   Patient presents with    Follow Up     Pt denies any chest pain at this time.         History of Present Illness:  Joel Weber is a 72 y.o. male who This is a 72 year old gentleman who came in for recurrent episodes of chest pain and his cardiac catheterization showed that there is diffuse disease in an extremely small nondominant ramus branch.  He has a longstanding history of hypertension diabetes mellitus and history of chewing tobacco.  His CT scan showed significant coronary artery calcification.  Cardiac catheterization showed that apart from the ramus disease the rest of the arteries did not have any significant disease.  The patient was told that his serum potassium was running low recently after the cardiac catheterization.  He has not had any recent labs.  He is not having any angina .  The patient is still chewing tobacco    The patient denies any cardiac symptoms.  Allergies:  Allergies   Allergen Reactions    Duloxetine Anaphylaxis and Hives/ Urticaria    Wheat      Other Reaction(s): Abdominal Pain    Ampicillin     Carbamazepine Hives/ Urticaria    Penicillin     Carbapenems     Celecoxib        Medications:  Current Outpatient Medications   Medication Sig    ACCU-CHEK FASTCLIX LANCET DRUM Does not apply Misc TEST BLOOD SUGAR ONCE DAILY    ACCU-CHEK GUIDE TEST STRIPS Does not apply Strip TEST BLOOD SUGAR ONCE DAILY    acetaminophen-codeine (TYLENOL #3) 300-30 mg Oral Tablet     albuterol sulfate  (PROVENTIL OR VENTOLIN OR PROAIR) 90 mcg/actuation Inhalation oral inhaler 2 puffs Inhalation every 4-6 hrs  As needed    albuterol sulfate (PROVENTIL) 2.5 mg /3 mL (0.083 %) Inhalation nebulizer solution 3 mL as needed Inhalation every 6 hrs    aspirin (ECOTRIN) 81 mg Oral Tablet, Delayed Release (E.C.) Take 1 Tablet (81 mg total) by mouth Once a day    azelastine (ASTELIN) 137 mcg (0.1 %) Nasal Aerosol, Spray 1 Spray    ipratropium/albuterol sulfate (IPRATROPIUM-ALBUTEROL INHL) 3 mL, Nebulized Inhalation, Soln-Inhalation, First Dose: 12/07/22 13:00:00 EST, Respiratory Neb Protocol? Yes    JARDIANCE 25 mg Oral Tablet Take 1 Tablet (25 mg total) by mouth Once a day    levothyroxine (SYNTHROID) 88 mcg Oral Tablet 1 tablet in the morning on an empty stomach Oral Once a day for 30 days    loratadine (CLARITIN) 10 mg Oral Tablet Take 1 Tablet (10 mg total) by mouth Once a day    LYRICA 150 mg Oral Capsule Take 1 Capsule (150 mg total) by mouth Three times a day    pantoprazole (PROTONIX) 40 mg Oral Tablet, Delayed Release (E.C.) Take 1 Tablet (40 mg total) by mouth Once a day  rosuvastatin (CRESTOR) 10 mg Oral Tablet Take 1 Tablet (10 mg total) by mouth Every evening       Patient History:  Past Medical History:   Diagnosis Date    Cough     Diabetes mellitus (CMS HCC)     Hypothyroidism     Shortness of breath     Sleep apnea          Past Surgical History:   Procedure Laterality Date    CARDIAC CATHETERIZATION      HX APPENDECTOMY      HX BACK SURGERY      X7    HX CHOLECYSTECTOMY      HX COLONOSCOPY      HX HEART VALVE SURGERY           Family Medical History:       Problem Relation (Age of Onset)    Asthma Father    COPD Father    Cancer Mother, Sister    Congestive Heart Failure Father    Diabetes Mother    Heart Attack Father, Brother    Heart Disease Brother    Hypertension (High Blood Pressure) Brother            Social History     Tobacco Use    Smoking status: Never    Smokeless tobacco: Current     Types:  Snuff   Vaping Use    Vaping status: Never Used   Substance Use Topics    Alcohol use: Never    Drug use: Never        Review of Systems:  General: Not present fatigue, significant weight gain or loss, fatigue or fever.  HEENT:  Not present sleep apnea, snoring, decreased hearing, headache.  Respiratory: Not present chronic cough, cough, decreased exercise tolerance, difficulty breathing on exertion, dyspnea, wheezing, shortness of breath, emphysema, COPD, black lung, or sleep apnea.  Cardiovascular: Not present chest pain, palpitations, orthopnea, claudication or edema.  Not present CHF, irregular heart beat, paroxysmal nocturnal dyspnea, PVD or syncope.  Gastrointestinal: Not present change in bowel habits, constipation, diarrhea, nausea or vomiting.  Musculoskeletal: Not present back pain, joint pain or joint swelling, arthritis, gout or osteoporosis  Neurological: Not present history of strokes or seizures, dizziness, presyncope or syncope, focal neurological symptoms.  Psychiatric: Not present anxiety or depression.  Endocrine: Not present appetite changes, cold or heat intolerance, thyroid problems or diabetes.  Hematology:  Not present anemia, petechiae or prolonged bleeding.      Physical Exam:  Constitutional: AA&O X3 Well developed and well nourished in no acute distress  Eyes: Conjunctiva clear, Pupils equal, round and reactive to light  HENT: Head is normocephalic, atraumatic   Neck: Normal ROM, Supple, symmetrical  Respiratory: Effort normal, clear to auscultation bilaterally.  Cardiovascular:  Normal first and second heart sounds.  There is no murmur or gallop.  Gastrointestinal: Bowel sounds normal; soft, non distended non-tender to palpation  Extremities:  There is no edema of the extremities.  Integumentary:  Skin is normal.  Neurologic: Grossly normal, no focal neuro deficit, normal coordination and gait  Psychiatric: normal affect and speech.       Data Reviewed:  I have reviewed the patient's  labs.  Pertinent results are as follows:  1Assessment and Plan:  . 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  2. Tobacco use.  Spoke to the patient at length about tobacco cessation.  3. Hyperlipidemia on medical therapy.  4.Erectile dysfunction Start Cialis  ICD-10-CM    1. Erectile dysfunction  N52.9       2. CAD in native artery  I25.10       3. Chest pain syndrome  R07.9       4. DM (diabetes mellitus) (CMS HCC)  E11.9         No orders of the defined types were placed in this encounter.    Return in about 6 months (around 04/23/2024).      Zenaida Deed, MD  10/24/2023, 14:35   [facility]  Cardiology  [facility]

## 2023-11-12 ENCOUNTER — Telehealth (INDEPENDENT_AMBULATORY_CARE_PROVIDER_SITE_OTHER): Payer: Self-pay | Admitting: PULMONARY DISEASE

## 2023-11-12 DIAGNOSIS — G4733 Obstructive sleep apnea (adult) (pediatric): Secondary | ICD-10-CM

## 2023-11-12 NOTE — Telephone Encounter (Signed)
Conducting chart pulls. Faxed compliance request to medi home and mailed pt letter to bring sd card to appt.      Amy Tiburcio Pea

## 2023-11-21 ENCOUNTER — Ambulatory Visit (INDEPENDENT_AMBULATORY_CARE_PROVIDER_SITE_OTHER): Payer: Self-pay | Admitting: NURSE PRACTITIONER

## 2023-12-06 NOTE — Discharge Summary (Signed)
 DISCHARGE SUMMARY  Joel Weber   73 y.o. male  [birth year]   [id number]    Date of Admission: 12/02/2023  Date of Discharge: 12/06/2023    Discharge diagnoses: Left lower quadrant abdominal pain   Resolved Problems    Diagnosis Date Noted Date Resolved   . *Left lower quadrant abdominal pain 12/03/2023 12/06/2023   . Elevated AST (SGOT) 12/03/2023 12/06/2023     Active Hospital Problems    Diagnosis Date Noted   . Peripheral artery disease 12/03/2023   . Obstructive sleep apnea 12/03/2023   . Coronary artery disease 12/03/2023   . Diabetes mellitus 12/03/2023   . 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 GERD (gastroesophageal reflux disease) 12/03/2023       Consultations :  General Surgery  Vascular surgeon.  Procedures:  None    Hospital course:   Joel Weber is a 73 y.o. male presenting with left-sided abdominal pain.  He states he has not had any flatus today.  He has had bowel movements however.  He states he may have had some slight nausea but no vomiting.  He has had small bowel obstructions in the past.  He underwent superior mesenteric artery stenting at the [facility] of [place]  approximately 2-1/2 weeks ago.  CT scan with IV contrast today demonstrates widely patent superior mesenteric artery, inferior mesenteric artery, and celiac artery with its branches.  Patient states that the pain today is dissimilar to what he experienced before.  He also underwent laparoscopy in [place] of [place] .  Bowel was found to be normal.  There was areas of adhesed bowel to each other and a previous small bowel anastomosis.  Patient's white blood count is normal as is his lactic acid.  His bicarb is normal.  Patient is overdue for his colonoscopy with the last one being 6 to 7 years ago.  Patient was evaluated at vascular surgeon that determined:  Patient with widely patent superior mesenteric artery stent with patent IMA celiac artery and its branches.  No evidence of arterial occlusive disease as a cause of patient's  symptoms.  Patient should follow-up with vascular for routine surveillance of his stent.  Consider evaluation by GI or general surgery.  Patient was treated for constipation with resolution of his symptoms.         Vitals:    12/06/23 1100   BP: 139/71   Pulse: 87   Resp: 20   Temp: 98.1 F (36.7 C)   SpO2:        Physical Exam  Vitals and nursing note reviewed.   Constitutional:       General: He is not in acute distress.     Appearance: Normal appearance. He is well-developed and normal weight. He is not diaphoretic.   HENT:      Head: Normocephalic and atraumatic.      Nose: Nose normal.   Eyes:      Extraocular Movements: Extraocular movements intact.      Pupils: Pupils are equal, round, and reactive to light.   Cardiovascular:      Rate and Rhythm: Normal rate and regular rhythm.      Pulses: Normal pulses.      Heart sounds: Normal heart sounds.   Pulmonary:      Effort: Pulmonary effort is normal.      Breath sounds: Normal breath sounds.   Abdominal:      Palpations: Abdomen is soft.      Tenderness: There is no abdominal tenderness.  Musculoskeletal:      Cervical back: Normal range of motion and neck supple.      Right lower leg: No edema.      Left lower leg: No edema.   Skin:     General: Skin is warm and dry.   Neurological:      General: No focal deficit present.      Mental Status: He is alert and oriented to person, place, and time.   Psychiatric:         Mood and Affect: Mood normal.         Behavior: Behavior normal.         Thought Content: Thought content normal.         Judgment: Judgment normal.          LAB 24H    Results from last 7 days   Lab Units 12/06/23  0111 12/05/23  0229 12/04/23  0303 12/02/23  1303   WBC AUTO 10*3/uL 7.3 9.2 8.7 8.2   HEMOGLOBIN g/dL 85.2 83.9 84.2 84.1   HEMATOCRIT % 42.0 46.2 44.5 45.5   PLATELETS AUTO 10*3/uL 185 173 214 246   NEUTROS PCT AUTO %  --   --   --  64   LYMPHS PCT AUTO %  --   --   --  24   MONOS PCT AUTO %  --   --   --  10   EOS PCT AUTO %  --    --   --  2      Results from last 7 days   Lab Units 12/06/23  0144 12/05/23  0229 12/04/23  0303   SODIUM mmol/L 133* 133* 135   POTASSIUM mmol/L 3.9 3.8 3.9   CHLORIDE mmol/L 102 101 102   CO2 mmol/L 25 21 21    BUN mg/dL 12 13 11    CREATININE mg/dL 9.16 9.22 9.17   CALCIUM mg/dL 8.0 8.1 8.5   TOTAL PROTEIN g/dL 6.7 7.0 7.3   BILIRUBIN TOTAL mg/dL 0.4 0.6 0.5   ALK PHOS U/L 75 73 79   ALT U/L 55* 75* 123*   AST U/L 26 34* 60*   GLUCOSE mg/dL 850* 879* 865*           Results from last 7 days   Lab Units 12/02/23  1509   LACTATE mmol/L 1.1                   Results from last 7 days   Lab Units 12/02/23  1303   PROTIME seconds 10.5      Results from last 7 days   Lab Units 12/02/23  1303   INR  0.97      Results from last 7 days   Lab Units 12/02/23  1303   APTT Seconds 32                 Results from last 7 days   Lab Units 12/03/23  0633   HEMOGLOBIN A1C % 8.3*      Results from last 7 days   Lab Units 12/02/23  1303   LIPASE U/L 50         IMAGING           === 11/16/23 ===    XR CHEST 1 VIEW    - Impression -  No radiographically acute cardiopulmonary process.    THIS DOCUMENT HAS BEEN ELECTRONICALLY SIGNED BY DEBBY DARING MD   === 12/02/23 ===  CT ABDOMEN PELVIS ANGIOGRAM W AND/OR WO IV CONTRAST    - Impression -  1.  Patent stent within the proximal SMA.    2.  Moderate stool throughout the colon, most prominently the sigmoid colon and rectum.    3.  Fatty infiltration of the liver.    4.  No acute findings.    Electronically signed by:  Lonni Canny MD  12/02/2023 04:01 PM EST RP Workstation: [id number]     Medications:     Your medication list        CONTINUE taking these medications        Instructions Last Dose Given Next Dose Due   apixaban 5 MG tablet  Commonly known as: Eliquis      Take 1 tablet (5 mg) by mouth in the morning and 1 tablet (5 mg) in the evening.       atenolol 25 MG tablet  Commonly known as: Tenormin      Take 1 tablet (25 mg) by mouth if needed each day (tachycardia).        azelastine 0.1 % nasal spray  Commonly known as: Astelin      Administer 1 spray into each nostril in the morning and at bedtime. Use in each nostril as directed       clopidogrel 75 MG tablet  Commonly known as: Plavix      Take 1 tablet (75 mg) by mouth in the morning.       Jardiance 25 MG  Generic drug: empagliflozin      Take 1 tablet (25 mg) by mouth in the morning.       levothyroxine 50 MCG tablet  Commonly known as: Synthroid, Levoxyl      Take 88 mcg by mouth before breakfast.       pantoprazole 40 MG EC tablet  Commonly known as: ProtoNix      Take 1 tablet (40 mg) by mouth before breakfast. Do not crush, chew, or split.       pregabalin 150 MG capsule  Commonly known as: Lyrica      Take 1 capsule (150 mg) by mouth in the morning, at noon, and at bedtime.                 Discharge Disposition: Discharge home.  Condition: Stable in good condition.  Activity: No extreme physical activity.  Diet: Adult Diet GI Soft   Instructions:   Follow-up with PCP and general surgery.    Return to ER with new or worsening symptoms.  Greater than 30 minutes spent on discharge.

## 2024-01-26 NOTE — ED Provider Notes (Signed)
 HPI   Chief Complaint   Patient presents with   . Abdominal Pain       Joel Weber is a 73 y.o. male who presents to the ED for evaluation of Abdominal Pain. Patient states left sided abdominal pain onset 2 days ago, urinary frequency onset last night, and nausea onset today. Patient states being on Eliquis.  He reports that he had to have a stent placed for a blockage to his bowel a couple of months ago in [place].  He reports normal bowel movements.  Patient denies any vomiting, diarrhea, constipation, and any other associated symptoms or modifying factors.                                              Patient History   Past Medical History:   Diagnosis Date   . Arthritis    . Diabetes mellitus    . GERD (gastroesophageal reflux disease)    . HTN (hypertension)      Past Surgical History:   Procedure Laterality Date   . APPENDECTOMY     . BACK SURGERY Bilateral     x 7   . CHOLECYSTECTOMY     . CT ABDOMEN PELVIS ANGIOGRAM W AND/OR WO IV CONTRAST  12/02/2023    CT ABDOMEN PELVIS ANGIOGRAM W AND/OR WO IV CONTRAST 12/02/2023 [facility] CT   . CT HEART CORONARY ANGIOGRAM  05/28/2023    CT HEART CORONARY ANGIOGRAM 05/28/2023   . ESOPHAGOGASTRODUODENOSCOPY     . HERNIA REPAIR     . KNEE ARTHROPLASTY Left 2021     Family History   Problem Relation Name Age of Onset   . No Known Problems Mother     . No Known Problems Father       Social History     Tobacco Use   . Smoking status: Never   . Smokeless tobacco: Never   Substance Use Topics   . Alcohol use: Never   . Drug use: Never         Review of Systems   Review of Systems   Constitutional:  Negative for activity change, appetite change, chills, fatigue and fever.   HENT:  Negative for ear pain, nosebleeds, sneezing, sore throat and trouble swallowing.    Eyes:  Negative for pain, discharge and visual disturbance.   Respiratory:  Negative for cough and shortness of breath.    Cardiovascular:  Negative for chest pain, palpitations and leg swelling.   Gastrointestinal:  Positive  for abdominal pain (left sided) and nausea. Negative for blood in stool, constipation, diarrhea and vomiting.   Endocrine: Negative for polydipsia and polyuria.   Genitourinary:  Positive for frequency. Negative for difficulty urinating, dysuria, flank pain and urgency.   Musculoskeletal:  Negative for back pain, joint swelling and neck stiffness.   Skin:  Negative for rash and wound.   Allergic/Immunologic: Negative for environmental allergies.   Neurological:  Negative for dizziness, tremors, syncope, light-headedness, numbness and headaches.   Psychiatric/Behavioral:  Negative for confusion, hallucinations and suicidal ideas.          Physical Exam   Physical Exam  Vitals and nursing note reviewed.   Constitutional:       General: He is awake. He is not in acute distress.     Appearance: Normal appearance. He is well-developed. He is not ill-appearing or diaphoretic.  HENT:      Head: Normocephalic and atraumatic.      Jaw: No trismus or swelling.      Right Ear: External ear normal.      Left Ear: External ear normal.      Nose: Nose normal.   Eyes:      General: No scleral icterus.        Right eye: No discharge.         Left eye: No discharge.      Extraocular Movements: Extraocular movements intact.      Conjunctiva/sclera: Conjunctivae normal.      Pupils: Pupils are equal, round, and reactive to light.   Cardiovascular:      Rate and Rhythm: Normal rate and regular rhythm.      Pulses: Normal pulses.      Heart sounds: Normal heart sounds. No murmur heard.     No gallop.   Pulmonary:      Effort: Pulmonary effort is normal. No tachypnea, bradypnea or respiratory distress.      Breath sounds: Normal breath sounds. No decreased breath sounds, wheezing, rhonchi or rales.   Chest:      Chest wall: No tenderness.   Abdominal:      General: Bowel sounds are normal. There is no distension.      Palpations: Abdomen is soft.      Tenderness: There is abdominal tenderness in the left lower quadrant. There is no  right CVA tenderness, left CVA tenderness, guarding or rebound.   Musculoskeletal:         General: No swelling, tenderness, deformity or signs of injury. Normal range of motion.      Cervical back: Normal range of motion and neck supple.      Right lower leg: No edema.      Left lower leg: No edema.   Skin:     General: Skin is warm and dry.      Capillary Refill: Capillary refill takes less than 2 seconds.      Coloration: Skin is not jaundiced.      Findings: No erythema or rash.   Neurological:      General: No focal deficit present.      Mental Status: He is alert and oriented to person, place, and time.      GCS: GCS eye subscore is 4. GCS verbal subscore is 5. GCS motor subscore is 6.      Cranial Nerves: Cranial nerves 2-12 are intact. No cranial nerve deficit.      Sensory: Sensation is intact.      Motor: Motor function is intact. No weakness.      Coordination: Coordination is intact. Coordination normal.   Psychiatric:         Attention and Perception: Attention normal.         Mood and Affect: Mood normal.         Speech: Speech normal.         Behavior: Behavior normal. Behavior is cooperative.         Cognition and Memory: Cognition and memory normal.         Judgment: Judgment normal.       ED Triage Vitals [01/26/24 1309]   Temp Heart Rate Resp BP   98.3 F (36.8 C) 107 19 144/78      SpO2 Temp Source Heart Rate Source Patient Position   97 % Oral -- Sitting      BP Location FiO2 (%)  Left arm --       Labs Reviewed   URINALYSIS WITH REFLEX TO CULTURE - Abnormal       Result Value    Color, Urine Light-Yellow      Clarity, Urine Clear      Glucose, Urine >1000 mg/dL (*)     Bilirubin, Urine Negative      Ketones, Urine 20 mg/dL (*)     Specific Gravity, Urine 1.041 (*)     Blood, Urine Negative      pH, Urine 6.0      Protein, Urine Normal      Urobilinogen, Urine Normal      Nitrite, Urine Negative      Leukocytes, Urine Negative      RBC #/HPF 2      WBC PER HPF <1      Squamous Epithelial  Cells <1     CBC WITH AUTO DIFFERENTIAL   COMPREHENSIVE METABOLIC PANEL   LIPASE     Radiology Reviewed - No data to display    Medical Decision Making (MDM) & ED Course   Medical Decision Making  Lab and imaging results were reviewed and discussed with patient.  CBC and chemistry show no acute findings.  Urinalysis shows specific gravity 1.041.  There are no bacteria and nitrites and leukocytes are negative.  CT of the abdomen and pelvis shows: 1.  No acute vascular findings or significant arterial stenosis.   2.  Subtle pelvic fat stranding suggesting seminal vesiculitis. Correlate for genitourinary infection.  Ultrasound of the scrotum shows: 1.  No evidence of testicular torsion or epididymoorchitis.  2.  Right-sided varicocele and hydrocele.  Patient was advised to have follow-up with primary care provider and with urology.  He was advised to return to the emergency department for continued or worsening symptoms or any other concerns.    Amount and/or Complexity of Data Reviewed  External Data Reviewed: notes.  Labs: ordered.  Radiology: ordered. Decision-making details documented in ED Course.      ED Course as of 01/27/24 2230   Tue Jan 27, 2024   2229 Lab and imaging results were reviewed and discussed with patient.  CBC and chemistry show no acute findings.  Urinalysis shows specific gravity 1.041.  There are no bacteria and nitrites and leukocytes are negative.  CT of the abdomen and pelvis shows: 1.  No acute vascular findings or significant arterial stenosis.   2.  Subtle pelvic fat stranding suggesting seminal vesiculitis. Correlate for genitourinary infection.  Ultrasound of the scrotum shows: 1.  No evidence of testicular torsion or epididymoorchitis.  2.  Right-sided varicocele and hydrocele.  Patient was advised to have follow-up with primary care provider and with urology.  He was advised to return to the emergency department for continued or worsening symptoms or any other concerns. [JF]      ED  Course User Index  [JF] Norleen Servant, DO         Diagnoses as of 01/27/24 2230   Varicocele   Hydrocele, unspecified hydrocele type     No orders to display   Izell Blumenthal scribing in the presence of  Norleen Servant, DO.               Norleen Servant, DO  01/26/24 1529       Norleen Servant, DO  01/27/24 2230

## 2024-03-16 ENCOUNTER — Other Ambulatory Visit (INDEPENDENT_AMBULATORY_CARE_PROVIDER_SITE_OTHER): Payer: Self-pay | Admitting: PULMONARY DISEASE

## 2024-03-16 DIAGNOSIS — G4733 Obstructive sleep apnea (adult) (pediatric): Secondary | ICD-10-CM

## 2024-04-12 NOTE — Progress Notes (Signed)
 Joel Weber  DOB: [birth year]  MR: [id number]    Subjective  Patient ID: Joel Weber is a 73 y.o. male who presents for Knee Pain of the Left Knee.    Left Knee     Knee Pain  Pertinent negatives include no chest pain, chills or fever.     Patient here today for left knee pain. Pain has been present for 1 year(s). The pain is at a severity of 8. The pain was not caused due to injury. The pain is described as throbbing, sharp, and aching. Aggravating factors include standing, walking, going up stairs, and going down stairs. Associated symptoms are decreased mobility, limping, weakness, swelling, popping, and instability. Relieving factors include nothing. Patient has tried NSAIDs, ice, heat, and activity modification, home exercises for #: 1 year(s) with no relief. Patient has tried steroid injections with relief. Patient does have difficulty with daily functions including preparing meals, dressing, driving, or walking. Patient reports not having falls within the past 6 months. Patient does not use assistive devices for ambulation.     Pain Scale: 8/10    Review of Systems   Constitutional:  Negative for chills and fever.   Respiratory:  Negative for shortness of breath.    Cardiovascular:  Negative for chest pain.        Vitals:    04/12/24 1047   BP: 124/74   Pulse: 99   Resp: 18   Temp: 98.7 F (37.1 C)         Current Outpatient Medications:   .  atenolol (Tenormin) 25 MG tablet, Take 1 tablet (25 mg) by mouth if needed each day (tachycardia)., Disp: , Rfl:   .  azelastine (Astelin) 0.1 % nasal spray, Administer 1 spray into each nostril in the morning and at bedtime. Use in each nostril as directed, Disp: , Rfl:   .  empagliflozin (Jardiance) 25 MG, Take 1 tablet (25 mg) by mouth in the morning., Disp: , Rfl:   .  levothyroxine (Synthroid, Levoxyl) 50 MCG tablet, Take 88 mcg by mouth before breakfast., Disp: , Rfl:   .  pantoprazole (ProtoNix) 40 MG EC tablet, Take 1 tablet (40 mg) by mouth before breakfast.  Do not crush, chew, or split., Disp: , Rfl:   .  pregabalin (Lyrica) 150 MG capsule, Take 1 capsule (150 mg) by mouth in the morning, at noon, and at bedtime., Disp: , Rfl:   .  apixaban (Eliquis) 5 MG tablet, Take 1 tablet (5 mg) by mouth in the morning and 1 tablet (5 mg) in the evening. (Patient not taking: Reported on 04/12/2024), Disp: , Rfl:   .  clopidogrel (Plavix) 75 MG tablet, Take 1 tablet (75 mg) by mouth in the morning. (Patient not taking: Reported on 04/12/2024), Disp: , Rfl:     ALLERGIES  Carbapenems, Celecoxib, Cymbalta [duloxetine hcl], Milk, Tegretol [carbamazepine], and Wheat    HISTORY  Past Medical History:   Diagnosis Date   . Arthritis    . Diabetes mellitus    . GERD (gastroesophageal reflux disease)    . HTN (hypertension)        Past Surgical History:   Procedure Laterality Date   . APPENDECTOMY     . BACK SURGERY Bilateral     x 7   . CHOLECYSTECTOMY     . CT ABDOMEN PELVIS ANGIOGRAM W AND/OR WO IV CONTRAST  12/02/2023    CT ABDOMEN PELVIS ANGIOGRAM W AND/OR WO IV CONTRAST 12/02/2023 [facility] CT   .  CT ABDOMEN PELVIS ANGIOGRAM W AND/OR WO IV CONTRAST  01/26/2024    CT ABDOMEN PELVIS ANGIOGRAM W AND/OR WO IV CONTRAST 01/26/2024 [facility] CT   . CT HEART CORONARY ANGIOGRAM  05/28/2023    CT HEART CORONARY ANGIOGRAM 05/28/2023   . ESOPHAGOGASTRODUODENOSCOPY     . HERNIA REPAIR     . KNEE ARTHROPLASTY Left 2021        Family History   Problem Relation Name Age of Onset   . No Known Problems Mother     . No Known Problems Father          Social History     Tobacco Use   Smoking Status Never   Smokeless Tobacco Never        Right Knee Exam     Muscle Strength   The patient has normal right knee strength.    Range of Motion   The patient has normal right knee ROM.      Left Knee Exam     Tests   McMurray:  Medial - positive Lateral - negative  Drawer:  Anterior - normal     Posterior - normal  Pivot shift: normal           Knee Musculoskeletal Exam  Gait    Limp: left    Inspection    Right      Right knee  inspection is normal.        Previous incision: anterior    Left      Erythema: none        Effusion: none        Edema: none        Ecchymosis: none        Deformity: mild        Alignment: varus      Palpation    Right      Right knee palpation is unremarkable.      Left      Crepitus: patellofemoral        Tenderness: present          Lateral joint line: mild          Medial joint line: severe      Range of Motion    Right      Right knee range of motion is normal.      Left      Active extension: 5      Active flexion: 115    Strength    Right      Right knee strength is normal.     Left      Extension: 4+/5. Extension is affected by pain.      Instability    Right      Instability signs: none - stable    Left      Varus stress grade: normal      Valgus stress grade: 1+      Pivot shift: normal      Anterior drawer: normal      Posterior drawer: normal      Quad active test: normal      Medial McMurray test: positive      Lateral McMurray test: negative      Lachman: negative    Neurovascular    Right      Right knee neurovascular exam is normal.      Left      Left knee neurovascular exam is normal.      General  Constitutional: appears stated age    Psychiatric: no acute distress    Neurological: alert and oriented x3        Assessment   Diagnosis Plan   1. Primary osteoarthritis of left knee        X-ray of the left knee personally viewed reveals advanced bone on bone OA     Plan  I had a long discussion with the patient.  At this point they have advanced knee arthritis. Treatment options discussed were NSAIDs, injections, and arthroplasty.  At some point in order to get the most complete pain relief they would benefit from total knee arthroplasty.  I explained the course of treatment is their decision.  No gurantee of result was given.  After discussion of the risks, benefits, and alternatives; the patient has elected to proceed with arthroplasty.  We will shedule a Left Total Knee Arthroplasty. I also  injected the knee with Depo today without complication.  We will see them back pre-operatively for further discussion in joint camp and with anesthesia for iovera nerve block.   Patient ID: Jerrian Mells is a 73 y.o. male.    Vitals:    04/12/24 1047   BP: 124/74   BP Location: Left arm   Pulse: 99   Resp: 18   Temp: 98.7 F (37.1 C)   TempSrc: Oral   Weight: 220 lb (99.8 kg)   Height: 5' 8 (1.727 m)   PainSc:   8   PainLoc: Knee       L Inj/Asp: L knee on 04/12/2024 11:22 AM  Indications: pain  Details: 21 G needle, anterolateral approach  Medications: 25 mg bupivacaine 0.5 %; 40 mg lidocaine  1 %; 40 mg methylPREDNISolone acetate 40 MG/ML    After discussing treatment options including alternatives, potential benefits and risks (including but not limited to: infection, bleeding, pain, allergic reaction and failure to help with pain). Patient would like to proceed with an injection.  Consent was obtained. Using sterile technique the site was prepped and ethyl chloride spray was used as local anesthetic. The joint was entered and medicine was then injected and the needle withdrawn.  The procedure was well tolerated.  The patient is asked to continue to rest the joint for a few more days before resuming regular activities. Advised on ice and compression as needed and NSAIDS as needed if tolerated.  It may be more painful for the first few days.  Watch for fever, drainage, or increased swelling or worsening pain in the joint. Call or return to clinic prn if such symptoms occur or there is failure to improve as anticipated.  Procedure, treatment alternatives, risks and benefits explained, specific risks discussed. Consent was given by the patient. Immediately prior to procedure a time out was called to verify the correct patient, procedure, equipment, support staff and site/side marked as required. Patient was prepped and draped in the usual sterile fashion.               The medication list for this visit has been  reviewed and reconciled.       Reviewed by Kaitlin Combs, LPN (Licensed Practical  Nurse) on 04/12/24 at 1048                  and confirmed by Cathryne Ellen, MA

## 2024-04-21 ENCOUNTER — Telehealth (INDEPENDENT_AMBULATORY_CARE_PROVIDER_SITE_OTHER): Payer: Self-pay | Admitting: PULMONARY DISEASE

## 2024-04-21 NOTE — Telephone Encounter (Signed)
 I checked the portals--- no data.  Called pt, both him and wife do not have VM set up    I called [facility], spoke with Christa. She said that he actually brought his SD card in yesterday and will be bringing compliance report. I asked that she please fax as well, just in case he forgets.

## 2024-04-23 ENCOUNTER — Other Ambulatory Visit: Payer: Self-pay

## 2024-04-23 ENCOUNTER — Encounter (INDEPENDENT_AMBULATORY_CARE_PROVIDER_SITE_OTHER): Payer: Self-pay | Admitting: NURSE PRACTITIONER

## 2024-04-23 ENCOUNTER — Ambulatory Visit: Payer: Self-pay | Attending: CARDIOVASCULAR DISEASE | Admitting: CARDIOVASCULAR DISEASE

## 2024-04-23 ENCOUNTER — Encounter (INDEPENDENT_AMBULATORY_CARE_PROVIDER_SITE_OTHER): Payer: Self-pay | Admitting: CARDIOVASCULAR DISEASE

## 2024-04-23 ENCOUNTER — Ambulatory Visit (HOSPITAL_BASED_OUTPATIENT_CLINIC_OR_DEPARTMENT_OTHER): Payer: Self-pay | Admitting: NURSE PRACTITIONER

## 2024-04-23 VITALS — BP 121/66 | HR 83 | Temp 97.4°F | Resp 17 | Ht 68.0 in | Wt 224.0 lb

## 2024-04-23 VITALS — BP 142/82 | HR 91 | Temp 96.7°F | Resp 16 | Ht 68.0 in | Wt 220.3 lb

## 2024-04-23 DIAGNOSIS — J309 Allergic rhinitis, unspecified: Secondary | ICD-10-CM

## 2024-04-23 DIAGNOSIS — E669 Obesity, unspecified: Secondary | ICD-10-CM | POA: Insufficient documentation

## 2024-04-23 DIAGNOSIS — I25811 Atherosclerosis of native coronary artery of transplanted heart without angina pectoris: Secondary | ICD-10-CM | POA: Insufficient documentation

## 2024-04-23 DIAGNOSIS — F17220 Nicotine dependence, chewing tobacco, uncomplicated: Secondary | ICD-10-CM

## 2024-04-23 DIAGNOSIS — I10 Essential (primary) hypertension: Secondary | ICD-10-CM | POA: Insufficient documentation

## 2024-04-23 DIAGNOSIS — R0609 Other forms of dyspnea: Secondary | ICD-10-CM | POA: Insufficient documentation

## 2024-04-23 DIAGNOSIS — Z720 Tobacco use: Secondary | ICD-10-CM | POA: Insufficient documentation

## 2024-04-23 DIAGNOSIS — E6609 Other obesity due to excess calories: Secondary | ICD-10-CM | POA: Insufficient documentation

## 2024-04-23 DIAGNOSIS — Z6834 Body mass index (BMI) 34.0-34.9, adult: Secondary | ICD-10-CM | POA: Insufficient documentation

## 2024-04-23 DIAGNOSIS — E66811 Obesity, class 1: Secondary | ICD-10-CM | POA: Insufficient documentation

## 2024-04-23 DIAGNOSIS — G4733 Obstructive sleep apnea (adult) (pediatric): Secondary | ICD-10-CM

## 2024-04-23 DIAGNOSIS — I2510 Atherosclerotic heart disease of native coronary artery without angina pectoris: Secondary | ICD-10-CM | POA: Insufficient documentation

## 2024-04-23 DIAGNOSIS — E782 Mixed hyperlipidemia: Secondary | ICD-10-CM | POA: Insufficient documentation

## 2024-04-23 DIAGNOSIS — K219 Gastro-esophageal reflux disease without esophagitis: Secondary | ICD-10-CM | POA: Insufficient documentation

## 2024-04-23 DIAGNOSIS — E119 Type 2 diabetes mellitus without complications: Secondary | ICD-10-CM | POA: Insufficient documentation

## 2024-04-23 DIAGNOSIS — Z01818 Encounter for other preprocedural examination: Secondary | ICD-10-CM | POA: Insufficient documentation

## 2024-04-23 DIAGNOSIS — Z572 Occupational exposure to dust: Secondary | ICD-10-CM

## 2024-04-23 DIAGNOSIS — Z7722 Contact with and (suspected) exposure to environmental tobacco smoke (acute) (chronic): Secondary | ICD-10-CM

## 2024-04-23 MED ORDER — NICOTINE 21 MG/24 HR DAILY TRANSDERMAL PATCH
21.0000 mg | MEDICATED_PATCH | Freq: Every day | TRANSDERMAL | 0 refills | Status: AC
Start: 2024-04-23 — End: 2024-05-07

## 2024-04-23 MED ORDER — TADALAFIL 5 MG TABLET
5.0000 mg | ORAL_TABLET | ORAL | 0 refills | Status: AC | PRN
Start: 2024-04-23 — End: ?

## 2024-04-23 MED ORDER — NICOTINE 14 MG/24 HR DAILY TRANSDERMAL PATCH
14.0000 mg | MEDICATED_PATCH | Freq: Every day | TRANSDERMAL | 0 refills | Status: AC
Start: 2024-04-23 — End: ?

## 2024-04-23 MED ORDER — NICOTINE 7 MG/24 HR DAILY TRANSDERMAL PATCH
7.0000 mg | MEDICATED_PATCH | Freq: Every day | TRANSDERMAL | 0 refills | Status: AC
Start: 2024-04-23 — End: 2024-05-07

## 2024-04-23 NOTE — Progress Notes (Signed)
 Cardiology [facility], [facility] [place]  [address]  [place] [address]  [phone]    Cardiology  Return Patient Progress Note    Name: Joel Weber   DOB: 1951/05/14  [73 y.o. male]   MRN: [id number]       Visit Date: 04/23/2024   Referring: No referring provider defined for this encounter.   PCP: Vellaiappan Somasundaram, MD         Reason for Visit:  Follow up visit with cardiology  Chief Complaint:   Chief Complaint   Patient presents with    Follow Up 6 Months        History of Present Illness:  Joel Weber is a 73 y.o. male who came for preoperative clearance as well as for follow-up.This is a 73 year old patient who came in for cardiac clearance as well as for follow-up.  He denies any cardiac symptoms.  The patient had a cardiac catheterization last year which showed diffuse disease in her an extremely small nondominant ramus branch.  He has a longstanding history of hypertension diabetes mellitus under chewing tobacco.  CT scan showed coronary artery calcification.  Apart from disease in the ramus branch the rest of the coronary arteries did not have any significant disease.  The patient underwent angioplasty of the superior mesenteric artery sometime back and is not having any abdominal discomfort since then.  The patient is not giving up on tobacco chewing habit.  Used to smoke in the past.  The patient I had a hemoglobin A1c of 9.8 which has come down after changing his diet.  The patient  denies any cardiac symptoms at this time    Cardiac Comorbidities    Heart failure  No history of heart failure   Family history of heart failure  No family history of heart failure   Family history of sudden cardiac death  No history of sudden death   Pulmonary hypertension  No history of pulmonary hypertension   Chronic Kidney Disease  No history of chronic kidney disease     Allergies:  Allergies[1]    Medications:  Current Outpatient Medications    Medication Sig    ACCU-CHEK FASTCLIX LANCET DRUM Does not apply Misc TEST BLOOD SUGAR ONCE DAILY    ACCU-CHEK GUIDE TEST STRIPS Does not apply Strip TEST BLOOD SUGAR ONCE DAILY    acetaminophen-codeine (TYLENOL #3) 300-30 mg Oral Tablet     albuterol sulfate (PROVENTIL OR VENTOLIN OR PROAIR) 90 mcg/actuation Inhalation oral inhaler 2 puffs Inhalation every 4-6 hrs  As needed    albuterol sulfate (PROVENTIL) 2.5 mg /3 mL (0.083 %) Inhalation nebulizer solution 3 mL as needed Inhalation every 6 hrs    ELIQUIS 5 mg Oral Tablet Take 1 Tablet (5 mg total) by mouth Twice daily    ipratropium/albuterol sulfate (IPRATROPIUM-ALBUTEROL INHL) 3 mL, Nebulized Inhalation, Soln-Inhalation, First Dose: 12/07/22 13:00:00 EST, Respiratory Neb Protocol? Yes    levothyroxine (SYNTHROID) 88 mcg Oral Tablet 1 tablet in the morning on an empty stomach Oral Once a day for 30 days (Patient taking differently: Take 1 Tablet (88 mcg total) by mouth Every morning)    loratadine (CLARITIN) 10 mg Oral Tablet Take 1 Tablet (10 mg total) by mouth Once a day    LYRICA 150 mg Oral Capsule Take 1 Capsule (150 mg total) by mouth Three times a day    pantoprazole (PROTONIX) 40 mg Oral Tablet, Delayed Release (E.C.) Take 1 Tablet (40 mg total) by  mouth Once a day    rosuvastatin  (CRESTOR ) 10 mg Oral Tablet Take 1 Tablet (10 mg total) by mouth Every evening       Patient History:  Past Medical History:   Diagnosis Date    Blood clot of common bile duct     Cough     Diabetes mellitus     Hypothyroidism     Shortness of breath     Sleep apnea          Past Surgical History:   Procedure Laterality Date    CARDIAC CATHETERIZATION      HX APPENDECTOMY      HX BACK SURGERY      X7    HX CHOLECYSTECTOMY      HX COLONOSCOPY      HX HEART VALVE SURGERY           Family Medical History:       Problem Relation (Age of Onset)    Asthma Father    COPD Father    Cancer Mother, Sister    Congestive Heart Failure Father    Diabetes Mother    Heart Attack Father,  Brother    Heart Disease Brother    Hypertension (High Blood Pressure) Brother            Social History[2]     Review of Systems:  General: Not present fatigue, significant weight gain or loss, fatigue or fever.  HEENT:  Not present sleep apnea, snoring, decreased hearing, headache.  Respiratory: Not present chronic cough, cough, decreased exercise tolerance, difficulty breathing on exertion, dyspnea, wheezing, shortness of breath, emphysema, COPD, black lung, or sleep apnea.  Cardiovascular: Not present chest pain, palpitations, orthopnea, claudication or edema.  Not present CHF, irregular heart beat, paroxysmal nocturnal dyspnea, PVD or syncope.  Gastrointestinal: Not present change in bowel habits, constipation, diarrhea, nausea or vomiting.  Musculoskeletal: Not present back pain, joint pain or joint swelling, arthritis, gout or osteoporosis  Neurological: Not present history of strokes or seizures, dizziness, presyncope or syncope, focal neurological symptoms.  Psychiatric: Not present anxiety or depression.  Endocrine: Not present appetite changes, cold or heat intolerance, thyroid problems or diabetes.  Hematology:  Not present anemia, petechiae or prolonged bleeding.      Physical Exam:  Constitutional: AA&O X3 Well developed and well nourished in no acute distress  Eyes: Conjunctiva clear, Pupils equal, round and reactive to light  HENT: Head is normocephalic, atraumatic   Neck: Normal ROM, Supple, symmetrical  Respiratory: Effort normal, clear to auscultation bilaterally.  Cardiovascular:  Normal first and second heart sounds.  There is no murmur or gallop.  Gastrointestinal: Bowel sounds normal; soft, non distended non-tender to palpation  Extremities:  No edema of the extremities.  Integumentary:  Skin is normal.  Neurologic: Grossly normal, no focal neuro deficit, normal coordination and gait  Psychiatric: normal affect and speech.       Data Reviewed:  I have reviewed the patient's labs.  Pertinent  results are as follows:  Labs are followed with the primary physician.        Assessment and Plan:  1. 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  Continue medical therapy.  2. Preoperative clearance the patient will be considered as moderate risk for cardiac complications during the proposed surgery.  He is planned to undergo knee surgery in the next few weeks.  3. Tobacco cessation strongly advised.  4. Diabetes mellitus managed by primary physician.  Follow-up in 6 months.  ICD-10-CM    1. 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 Tobacco use  Z72.0       4. Coronary artery disease involving native artery of transplanted heart without angina pectoris  I25.811       5. DM (diabetes mellitus)  E11.9       6. Essential hypertension  I10       7. Preoperative clearance  Z01.818         No orders of the defined types were placed in this encounter.    Return in about 6 months (around 10/23/2024).      Arnesha Schiraldi, MD  04/23/2024, 12:31   [facility]  Cardiology  [facility]       [1]   Allergies  Allergen Reactions    Duloxetine Anaphylaxis and Hives/ Urticaria    Wheat      Other Reaction(s): Abdominal Pain    Ampicillin     Carbamazepine Hives/ Urticaria    Penicillin     Carbapenems     Celecoxib    [2]   Social History  Tobacco Use    Smoking status: Never    Smokeless tobacco: Current     Types: Snuff   Vaping Use    Vaping status: Never Used   Substance Use Topics    Alcohol use: Never    Drug use: Never

## 2024-04-23 NOTE — Nursing Note (Signed)
 Epworth Scale Score:  Score total: 4    Are you currently on CPAP/BIPAP/TRILOGY CPAP  DME Company Information: [facility]  Has previous OSA symptoms resolved with CPAP/BIPAP/TRILOGY?Yes  Download available? Yes  Currently experiencing any additional sleep related issues?No  Oxygen therapy?  No  DME Company Information: na  What Liter Flow is the patient on? na  Have you received a flu shot? Yes  Have you received a pneumonia shot? Yes  Smoking History:    Social History     Tobacco Use   Smoking Status Never   Smokeless Tobacco Current     Have you had any recent hospitalizations?  No  Has the patient had any tests performed since the last visit? None  If so, where were the test(s) performed? na  Does the patient have any other associated symptoms or modifying factors? Shortness of breath      Eleanor Pepper, [place]

## 2024-04-23 NOTE — Progress Notes (Signed)
 PULMONARY, [facility] PULMONOLOGY  [address]  [place] [address]  Operated by [facility]     Follow up/Progress Note    Patient Name: Joel Weber  Date: 04/23/2024  Department:  PULMONARY, [facility] PULMONOLOGY  MRN: [id number]  DOB: 05-31-1951  Primary Care Provider:  Pascual Memos, MD  Referring Provider:  No ref. provider found      Chief Complaint:   Chief Complaint   Patient presents with    Sleep Apnea    Shortness of Breath     Nursing Notes:   Franchot Setter, [place]  04/23/24 9062  Signed  Epworth Scale Score:  Score total: 4    Are you currently on CPAP/BIPAP/TRILOGY CPAP  DME Company Information: [employer]  Has previous OSA symptoms resolved with CPAP/BIPAP/TRILOGY?Yes  Download available? Yes  Currently experiencing any additional sleep related issues?No  Oxygen therapy?  No  DME Company Information: na  What Liter Flow is the patient on? na  Have you received a flu shot? Yes  Have you received a pneumonia shot? Yes  Smoking History:    Social History     Tobacco Use   Smoking Status Never   Smokeless Tobacco Current     Have you had any recent hospitalizations?  No  Has the patient had any tests performed since the last visit? None  If so, where were the test(s) performed? na  Does the patient have any other associated symptoms or modifying factors? Shortness of breath      Setter Franchot, MA       HPI:  73 y.o. male with severe OSA (AHI 47.8).  He was initially diagnosed at outside facility and had difficulty with CPAP tolerance.  He was re-titrated 02/24/2023 and has done well with CPAP since that time.  He reports good control of OSA symptoms.  Download from 04/19/2024 shows 87% nights with greater than 4 hour use with residual AHI 3.3 on 8 cm H2O.     He was initially evaluated during hospitalization 12/05/2022 for shortness of breath.  Had negative CT chest.  Echo showed some diastolic dysfunction.  Shortness of breath ultimately improved.  He  had follow-up 02/03/2023 with normal PFTs.  He feels like his breathing is about the same.  He reports continued exertional shortness of breath but does note benefit with albuterol.  He denies any cough or wheezing.  He continues to follow with Cardiology and has appointment later today.  He is a lifelong nonsmoker.but did have secondhand smoke exposure. 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       Past Medical History:  Past Medical History:   Diagnosis Date    Blood clot of common bile duct     Cough     Diabetes mellitus     Hypothyroidism     Shortness of breath     Sleep apnea      Past Surgical History  Past Surgical History:   Procedure Laterality Date    CARDIAC CATHETERIZATION      HX APPENDECTOMY      HX BACK SURGERY      X7    HX CHOLECYSTECTOMY      HX COLONOSCOPY      HX HEART VALVE SURGERY       Medication List  Current Outpatient Medications   Medication Sig    ACCU-CHEK FASTCLIX LANCET DRUM Does not apply Misc TEST BLOOD SUGAR ONCE DAILY    ACCU-CHEK GUIDE  TEST STRIPS Does not apply Strip TEST BLOOD SUGAR ONCE DAILY    acetaminophen-codeine (TYLENOL #3) 300-30 mg Oral Tablet     albuterol sulfate (PROVENTIL OR VENTOLIN OR PROAIR) 90 mcg/actuation Inhalation oral inhaler 2 puffs Inhalation every 4-6 hrs  As needed    albuterol sulfate (PROVENTIL) 2.5 mg /3 mL (0.083 %) Inhalation nebulizer solution 3 mL as needed Inhalation every 6 hrs    ELIQUIS 5 mg Oral Tablet Take 1 Tablet (5 mg total) by mouth Twice daily    ipratropium/albuterol sulfate (IPRATROPIUM-ALBUTEROL INHL) 3 mL, Nebulized Inhalation, Soln-Inhalation, First Dose: 12/07/22 13:00:00 EST, Respiratory Neb Protocol? Yes    levothyroxine (SYNTHROID) 88 mcg Oral Tablet 1 tablet in the morning on an empty stomach Oral Once a day for 30 days    loratadine (CLARITIN) 10 mg Oral Tablet Take 1 Tablet (10 mg total) by mouth Once a day    LYRICA 150 mg Oral Capsule Take 1 Capsule (150 mg total) by mouth Three times a day     pantoprazole (PROTONIX) 40 mg Oral Tablet, Delayed Release (E.C.) Take 1 Tablet (40 mg total) by mouth Once a day    rosuvastatin  (CRESTOR ) 10 mg Oral Tablet Take 1 Tablet (10 mg total) by mouth Every evening     Allergy List  Allergy History as of 04/23/24       WHEAT         Noted Status Severity Type Reaction    02/03/23 0918 Copen, Lucinda 11/08/21 Active High      Comments: Other Reaction(s): Abdominal Pain               AMPICILLIN         Noted Status Severity Type Reaction    02/03/23 0918 Copen, Lucinda 02/03/23 Active Medium                CARBAMAZEPINE         Noted Status Severity Type Reaction    02/03/23 0918 Copen, Lucinda 12/17/22 Active Medium  Hives/ Urticaria              DULOXETINE         Noted Status Severity Type Reaction    02/03/23 0918 Copen, Lucinda 12/17/22 Active High  Anaphylaxis, Hives/ Urticaria              PENICILLIN         Noted Status Severity Type Reaction    02/03/23 0918 Copen, Lucinda 02/03/23 Active Medium                CARBAPENEMS         Noted Status Severity Type Reaction    02/03/23 0918 Copen, Lucinda 11/08/21 Active                 CELECOXIB         Noted Status Severity Type Reaction    02/03/23 0918 Copen, Lucinda 11/08/21 Active                 MILK         Noted Status Severity Type Reaction    10/03/23 0737 Witthohn-AtkinsLevada, LPN 98/87/76 Deleted       02/03/23 0918 Copen, Lucinda 11/08/21 Active                 BARIUM SULFATE         Noted Status Severity Type Reaction    10/03/23 0738 Witthohn-AtkinsLevada, LPN 95/91/75 Deleted Low  Hives/ Urticaria  02/03/23 0918 Copen, Lucinda 02/03/23 Active Low  Hives/ Urticaria                  Family History   Family Medical History:       Problem Relation (Age of Onset)    Asthma Father    COPD Father    Cancer Mother, Sister    Congestive Heart Failure Father    Diabetes Mother    Heart Attack Father, Brother    Heart Disease Brother    Hypertension (High Blood Pressure) Brother            Social  History  Social History     Socioeconomic History    Marital status: Married   Tobacco Use    Smoking status: Never    Smokeless tobacco: Current     Types: Snuff   Vaping Use    Vaping status: Never Used   Substance and Sexual Activity    Alcohol use: Never    Drug use: Never     Social Determinants of Health     Financial Resource Strain: Low Risk  (12/03/2023)    Received from [facility]    Overall Financial Resource Strain (CARDIA)     Difficulty of Paying Living Expenses: Not very hard   Transportation Needs: No Transportation Needs (12/03/2023)    Received from [facility]    [facility] - Transportation     Lack of Transportation (Medical): No     Lack of Transportation (Non-Medical): No   Social Connections: High Risk (10/03/2023)    Social Connections     SDOH Social Isolation: Less than once a week   Intimate Partner Violence: Not At Risk (11/20/2023)    Received from [facility]    Humiliation, Afraid, Rape, and Kick questionnaire     Fear of Current or Ex-Partner: No     Emotionally Abused: No     Physically Abused: No     Sexually Abused: No   Housing Stability: Low Risk  (11/20/2023)    Received from [facility]    Housing Stability Vital Sign     Unable to Pay for Housing in the Last Year: No     Number of Times Moved in the Last Year: 1     Homeless in the Last Year: No          Objective:  Vital Signs  Vitals:    04/23/24 0934   BP: 121/66   Pulse: 83   Resp: 17   Temp: 36.3 C (97.4 F)   SpO2: 94%   Weight: 102 kg (224 lb)   Height: 1.727 m (5' 8)   BMI: 34.06         PHYSICAL EXAMINATION:   Constitutional:  Vital signs stable.  General appearance of the patient:  Alert, no acute distress.  Normal appearance, well nourished.  Eyes: PERRLA and normal eye lids.  Conjunctiva normal.  Ears, Nose, Mouth, and Throat: External inspection of ears and nose with normal appearance.  Inspection of lips, teeth and gums with normal appearance and oral mucosa normal.  Neck: Supple with  trachea midline, non tender, no nodules, no masses, gland position midline.  Respiratory:  Auscultation of lungs with normal breath sounds, no rales, no rhonchi, no wheezing.  Respiratory effort with no tractions, breathing regular and unlabored.  Cardiovascular:  Regular rhythm and regular rate.  No murmur, no peripheral edema.  Gastrointestinal: Abdomen non-tender, no masses, no hepatomegaly present.  Musculoskeletal:  Normal gait and station, normal digits, no digital cyanosis or clubbing.  Mental Status/Psychiatric:  Alert, grossly oriented to person, place, and time.  Appropriate and normal mood.        Assessment    ICD-10-CM    1. OSA (obstructive sleep apnea)  G47.33       2. Dyspnea on exertion  R06.09       3. Allergic rhinitis  J30.9       4. Gastroesophageal reflux disease without esophagitis  K21.9       5. Class 1 obesity due to excess calories with serious comorbidity and body mass index (BMI) of 34.0 to 34.9 in adult  E66.811     E66.09     Z68.34             Plan  OSA remains well controlled.  Continue CPAP at current settings.  Continue PRN albuterol for shortness of breath.  RTC in 1 year but instructed him to call sooner for any needs.    Continue medications as prescribed/directed unless changed by provider.    Plan of care discussed with patient.    Return in about 50 weeks (around 04/08/2025) for with Rosaline Elbe, NP, Dr. Cathay pod.    The patient was given the opportunity to ask questions and those questions were answered to the patient's satisfaction. The patient was encouraged to call with any additional questions or concerns. Discussed with the patient effects and side effects of medications. Medication safety was discussed.  The patient was informed to contact the office within 7 business days if a message/lab results/referral/imaging results have not been conveyed to the patient.    Electronically signed by Rosaline LITTIE Elbe, APRN-FNP-BC      This note may have been partially  generated using MModal Fluency Direct system, and there may be some incorrect words, spellings, and punctuation that were not noted in checking the note before saving.

## 2024-04-23 NOTE — Addendum Note (Signed)
 Addended by: Oakley Kossman on: 04/23/2024 12:40 PM     Modules accepted: Orders

## 2024-05-05 ENCOUNTER — Encounter (INDEPENDENT_AMBULATORY_CARE_PROVIDER_SITE_OTHER): Payer: Self-pay | Admitting: PULMONARY DISEASE

## 2024-11-10 ENCOUNTER — Ambulatory Visit (INDEPENDENT_AMBULATORY_CARE_PROVIDER_SITE_OTHER): Payer: Self-pay | Admitting: CARDIOVASCULAR DISEASE

## 2024-11-12 ENCOUNTER — Ambulatory Visit (INDEPENDENT_AMBULATORY_CARE_PROVIDER_SITE_OTHER): Payer: Self-pay | Admitting: CARDIOVASCULAR DISEASE

## 2025-04-05 ENCOUNTER — Ambulatory Visit (INDEPENDENT_AMBULATORY_CARE_PROVIDER_SITE_OTHER): Admitting: CARDIOVASCULAR DISEASE

## 2025-04-08 ENCOUNTER — Ambulatory Visit (INDEPENDENT_AMBULATORY_CARE_PROVIDER_SITE_OTHER): Payer: Self-pay | Admitting: NURSE PRACTITIONER
